# Patient Record
Sex: Female | Born: 1955 | Race: White | Hispanic: No | Smoking: Former smoker
Health system: Southern US, Community
[De-identification: ages and names within clinical notes are randomized; demographics above are authoritative.]

## PROBLEM LIST (undated history)

## (undated) DIAGNOSIS — I10 Essential (primary) hypertension: Secondary | ICD-10-CM

## (undated) DIAGNOSIS — E669 Obesity, unspecified: Secondary | ICD-10-CM

## (undated) DIAGNOSIS — C801 Malignant (primary) neoplasm, unspecified: Secondary | ICD-10-CM

## (undated) DIAGNOSIS — T7840XA Allergy, unspecified, initial encounter: Secondary | ICD-10-CM

## (undated) DIAGNOSIS — K219 Gastro-esophageal reflux disease without esophagitis: Secondary | ICD-10-CM

## (undated) DIAGNOSIS — F419 Anxiety disorder, unspecified: Secondary | ICD-10-CM

## (undated) DIAGNOSIS — E785 Hyperlipidemia, unspecified: Secondary | ICD-10-CM

## (undated) DIAGNOSIS — J301 Allergic rhinitis due to pollen: Secondary | ICD-10-CM

## (undated) DIAGNOSIS — F32A Depression, unspecified: Secondary | ICD-10-CM

## (undated) DIAGNOSIS — Z923 Personal history of irradiation: Secondary | ICD-10-CM

## (undated) DIAGNOSIS — C50919 Malignant neoplasm of unspecified site of unspecified female breast: Secondary | ICD-10-CM

## (undated) HISTORY — DX: Allergy, unspecified, initial encounter: T78.40XA

## (undated) HISTORY — DX: Allergic rhinitis due to pollen: J30.1

## (undated) HISTORY — DX: Malignant (primary) neoplasm, unspecified: C80.1

## (undated) HISTORY — DX: Gastro-esophageal reflux disease without esophagitis: K21.9

## (undated) HISTORY — PX: TUBAL LIGATION: SHX77

## (undated) HISTORY — DX: Malignant neoplasm of unspecified site of unspecified female breast: C50.919

## (undated) HISTORY — DX: Essential (primary) hypertension: I10

## (undated) HISTORY — DX: Depression, unspecified: F32.A

## (undated) HISTORY — DX: Hyperlipidemia, unspecified: E78.5

## (undated) HISTORY — DX: Obesity, unspecified: E66.9

## (undated) HISTORY — DX: Anxiety disorder, unspecified: F41.9

---

## 2003-08-20 HISTORY — PX: CHOLECYSTECTOMY: SHX55

## 2008-08-19 HISTORY — PX: BREAST LUMPECTOMY: SHX2

## 2009-04-05 ENCOUNTER — Encounter: Admission: RE | Admit: 2009-04-05 | Discharge: 2009-04-05 | Payer: Self-pay | Admitting: Surgery

## 2009-04-07 ENCOUNTER — Encounter: Admission: RE | Admit: 2009-04-07 | Discharge: 2009-04-07 | Payer: Self-pay | Admitting: Surgery

## 2009-04-11 ENCOUNTER — Encounter (INDEPENDENT_AMBULATORY_CARE_PROVIDER_SITE_OTHER): Payer: Self-pay | Admitting: Surgery

## 2009-04-11 ENCOUNTER — Ambulatory Visit (HOSPITAL_BASED_OUTPATIENT_CLINIC_OR_DEPARTMENT_OTHER): Admission: RE | Admit: 2009-04-11 | Discharge: 2009-04-11 | Payer: Self-pay | Admitting: Surgery

## 2009-04-28 ENCOUNTER — Ambulatory Visit: Payer: Self-pay | Admitting: Oncology

## 2009-05-10 LAB — CBC WITH DIFFERENTIAL/PLATELET
BASO%: 1.1 % (ref 0.0–2.0)
Basophils Absolute: 0 10*3/uL (ref 0.0–0.1)
HCT: 34.7 % — ABNORMAL LOW (ref 34.8–46.6)
HGB: 12.1 g/dL (ref 11.6–15.9)
MCHC: 34.9 g/dL (ref 31.5–36.0)
MONO#: 0.5 10*3/uL (ref 0.1–0.9)
NEUT%: 46.2 % (ref 38.4–76.8)
WBC: 4.1 10*3/uL (ref 3.9–10.3)
lymph#: 1.5 10*3/uL (ref 0.9–3.3)

## 2009-05-11 LAB — COMPREHENSIVE METABOLIC PANEL
ALT: 29 U/L (ref 0–35)
Albumin: 4.1 g/dL (ref 3.5–5.2)
CO2: 26 mEq/L (ref 19–32)
Calcium: 9.1 mg/dL (ref 8.4–10.5)
Chloride: 103 mEq/L (ref 96–112)
Creatinine, Ser: 0.83 mg/dL (ref 0.40–1.20)
Total Protein: 7 g/dL (ref 6.0–8.3)

## 2009-05-11 LAB — LACTATE DEHYDROGENASE: LDH: 105 U/L (ref 94–250)

## 2009-06-29 ENCOUNTER — Ambulatory Visit: Payer: Self-pay | Admitting: Oncology

## 2009-10-17 ENCOUNTER — Ambulatory Visit: Payer: Self-pay | Admitting: Oncology

## 2009-10-19 LAB — CBC WITH DIFFERENTIAL/PLATELET
Basophils Absolute: 0 10*3/uL (ref 0.0–0.1)
Eosinophils Absolute: 0.1 10*3/uL (ref 0.0–0.5)
HCT: 36.8 % (ref 34.8–46.6)
HGB: 12.6 g/dL (ref 11.6–15.9)
NEUT#: 2.1 10*3/uL (ref 1.5–6.5)
NEUT%: 48 % (ref 38.4–76.8)
RDW: 12.8 % (ref 11.2–14.5)
lymph#: 1.5 10*3/uL (ref 0.9–3.3)

## 2009-10-19 LAB — COMPREHENSIVE METABOLIC PANEL
Albumin: 3.9 g/dL (ref 3.5–5.2)
BUN: 19 mg/dL (ref 6–23)
CO2: 32 mEq/L (ref 19–32)
Calcium: 9.1 mg/dL (ref 8.4–10.5)
Chloride: 99 mEq/L (ref 96–112)
Creatinine, Ser: 0.81 mg/dL (ref 0.40–1.20)
Glucose, Bld: 97 mg/dL (ref 70–99)
Potassium: 3.6 mEq/L (ref 3.5–5.3)

## 2009-10-19 LAB — LACTATE DEHYDROGENASE: LDH: 135 U/L (ref 94–250)

## 2009-10-19 LAB — VITAMIN D 25 HYDROXY (VIT D DEFICIENCY, FRACTURES): Vit D, 25-Hydroxy: 30 ng/mL (ref 30–89)

## 2010-10-19 ENCOUNTER — Other Ambulatory Visit: Payer: Self-pay | Admitting: Oncology

## 2010-10-19 ENCOUNTER — Encounter (HOSPITAL_BASED_OUTPATIENT_CLINIC_OR_DEPARTMENT_OTHER): Payer: BC Managed Care – PPO | Admitting: Oncology

## 2010-10-19 DIAGNOSIS — C50519 Malignant neoplasm of lower-outer quadrant of unspecified female breast: Secondary | ICD-10-CM

## 2010-10-19 LAB — COMPREHENSIVE METABOLIC PANEL
ALT: 83 U/L — ABNORMAL HIGH (ref 0–35)
Alkaline Phosphatase: 251 U/L — ABNORMAL HIGH (ref 39–117)
CO2: 32 mEq/L (ref 19–32)
Potassium: 3.8 mEq/L (ref 3.5–5.3)
Sodium: 140 mEq/L (ref 135–145)
Total Bilirubin: 0.6 mg/dL (ref 0.3–1.2)
Total Protein: 7.6 g/dL (ref 6.0–8.3)

## 2010-10-19 LAB — CBC WITH DIFFERENTIAL/PLATELET
BASO%: 0.9 % (ref 0.0–2.0)
LYMPH%: 29.7 % (ref 14.0–49.7)
MCHC: 34.4 g/dL (ref 31.5–36.0)
MONO#: 0.6 10*3/uL (ref 0.1–0.9)
Platelets: 229 10*3/uL (ref 145–400)
RBC: 3.95 10*6/uL (ref 3.70–5.45)
RDW: 13 % (ref 11.2–14.5)
WBC: 5 10*3/uL (ref 3.9–10.3)
lymph#: 1.5 10*3/uL (ref 0.9–3.3)

## 2010-10-19 LAB — LACTATE DEHYDROGENASE: LDH: 107 U/L (ref 94–250)

## 2010-10-26 ENCOUNTER — Encounter (HOSPITAL_BASED_OUTPATIENT_CLINIC_OR_DEPARTMENT_OTHER): Payer: BC Managed Care – PPO | Admitting: Oncology

## 2010-10-26 DIAGNOSIS — C50519 Malignant neoplasm of lower-outer quadrant of unspecified female breast: Secondary | ICD-10-CM

## 2010-11-24 LAB — CBC
HCT: 36.3 % (ref 36.0–46.0)
MCV: 89.7 fL (ref 78.0–100.0)
RBC: 4.05 MIL/uL (ref 3.87–5.11)
WBC: 4.2 10*3/uL (ref 4.0–10.5)

## 2010-11-24 LAB — COMPREHENSIVE METABOLIC PANEL
BUN: 12 mg/dL (ref 6–23)
CO2: 33 mEq/L — ABNORMAL HIGH (ref 19–32)
Chloride: 105 mEq/L (ref 96–112)
Creatinine, Ser: 0.81 mg/dL (ref 0.4–1.2)
GFR calc non Af Amer: >60 mL/min (ref >60)
Total Bilirubin: 0.6 mg/dL (ref 0.3–1.2)

## 2010-11-24 LAB — DIFFERENTIAL
Basophils Absolute: 0 10*3/uL (ref 0.0–0.1)
Lymphocytes Relative: 34 % (ref 12–46)
Neutro Abs: 2.2 10*3/uL (ref 1.7–7.7)
Neutrophils Relative %: 51 % (ref 43–77)

## 2010-12-20 ENCOUNTER — Encounter (INDEPENDENT_AMBULATORY_CARE_PROVIDER_SITE_OTHER): Payer: Self-pay | Admitting: Surgery

## 2011-01-01 NOTE — Op Note (Signed)
[name], [name]              ACCOUNT NO.:  [id number]   MEDICAL RECORD NO.:  [id number]          PATIENT TYPE:  AMB   LOCATION:  DSC                          FACILITY:  [facility]   PHYSICIAN:  Thomas A. Cornett, M.D.DATE OF BIRTH:  1955-11-16   DATE OF PROCEDURE:  04/11/2009  DATE OF DISCHARGE:                               OPERATIVE REPORT   PREOPERATIVE DIAGNOSIS:  Right breast cancer stage I.   POSTOPERATIVE DIAGNOSIS:  Right breast cancer stage I.   PROCEDURES:  1. Right breast partial mastectomy with needle localization.  2. Right sentinel lymph node mapping with injection of methylene blue      dye.   SURGEON:  Maisie Fus A. Cornett, MD   ANESTHESIA:  LMA with 0.25% Sensorcaine local.   ESTIMATED BLOOD LOSS:  30 mL.   SPECIMENS:  1. Right breast mass with localizing wire and clip to pathology.  2. Right axillary sentinel lymph nodes x6 negative by touch prep.   DRAINS:  None.   INDICATIONS FOR PROCEDURE:  The patient is a 55 year old female who was  found to have right breast cancer.  It was a centrally located lesion  under 2 cm.  We felt we could attempt breast conserving measures in her.  She wished to undergo lumpectomy with sentinel lymph node mapping for  this.  Informed consent was obtained.   DESCRIPTION OF PROCEDURE:  The patient was brought to the operating room  and placed supine.  After induction of general anesthesia, the right  breast was prepped and draped in sterile fashion.  4 mL of methylene  blue dye were injected in a subareolar, subcutaneous position.  Neoprobe  was used.  She underwent preop nuclear medicine injection and wire  localization by the radiologist.  Neoprobe was used to find the hot spot  in right axilla and incision was made in the right axilla.  Dissection  was carried into the right axilla.  We initially identified what was  felt to be 3 hot sentinel nodes, 2 and 3 were blue.  The final specimen  noted there were 3 additional  nodes with these nodes and these were all  negative by touch prep.  Hemostasis was achieved in the axilla.   The lumpectomy is done next.  Curvilinear incision was made along the  lateral border of the right nipple.  This lesion was subareolar, but I  felt there is an ovary between the mass in the nipple itself where I  could do a central lumpectomy.  I used cautery to dissect around the  mass.  There were some fibrocystic changes with it which made it  difficult, but I could feel the mass and was felt separate in the  fibrocystic changes.  The mass was taken as well as an additional medial  margin was taken as well.  Radiographs revealed this to be adequate.  The clip and wire were in the specimen.  There is a significant central  lumpectomy cavity.  I mobilized the breast tissue off the chest wall and  I closed this in layers with a deep layer to fill in  this gap as best as  I could.  I closed it once with 3-0 Vicryl and 3-0 Monocryl to the skin.  It did not likely looks when we took out the 3-0 Monocryl.  I trimmed  some more nipple skin away since it looks somewhat ischemic to me and  then re-closed it again actually was much happy with this cosmetic  result.  There was some reduction in size in the right breast, but I  think the cavity closed as well as it could.  Considering the location  of the tumor in the nipple, I felt the margins were going to be negative  at least grossly.  The axilla was closed in a similar fashion with a  deep layer of 3-0 Vicryl and subsequent 4-0 Monocryl stitch.  Dermabond  was applied.  All final counts of sponge, needle, and instruments were  found to be correct at this portion of the case.  The patient was awoke  after Ace wrapping the right breast, taken to recovery in satisfactory  condition.      Thomas A. Cornett, M.D.  Electronically Signed     TAC/MEDQ  D:  04/11/2009  T:  04/12/2009  Job:  [id number]   cc:   [name]

## 2011-05-13 ENCOUNTER — Encounter: Payer: Self-pay | Admitting: Surgery

## 2011-06-14 ENCOUNTER — Encounter (INDEPENDENT_AMBULATORY_CARE_PROVIDER_SITE_OTHER): Payer: Self-pay | Admitting: Surgery

## 2011-09-16 ENCOUNTER — Encounter (INDEPENDENT_AMBULATORY_CARE_PROVIDER_SITE_OTHER): Payer: Self-pay | Admitting: Surgery

## 2011-09-16 ENCOUNTER — Ambulatory Visit (INDEPENDENT_AMBULATORY_CARE_PROVIDER_SITE_OTHER): Payer: BC Managed Care – PPO | Admitting: Surgery

## 2011-09-16 VITALS — BP 134/96 | HR 60 | Temp 98.0°F | Resp 12 | Ht 67.0 in | Wt 200.6 lb

## 2011-09-16 DIAGNOSIS — Z853 Personal history of malignant neoplasm of breast: Secondary | ICD-10-CM

## 2011-09-16 NOTE — Progress Notes (Signed)
NAME: [name]       DOB: 05/15/1956           DATE: 09/16/2011       MRN: [id number]   [name] is a 56 y.o.Marland Kitchenfemale who presents for routine followup of her Right breast cancer Stage 1 diagnosed in 03/2009  and treated with breast conservation. She has no problems or concerns on either side.  PFSH: She has had no significant changes since the last visit here.  ROS: There have been no significant changes since the last visit here  EXAM: General: The patient is alert, oriented, generally healty appearing, NAD. Mood and affect are normal.  Breasts:  Post op changes to right breast no masses  Left breast nornal  Lymphatics: She has no axillary or supraclavicular adenopathy on either side.  Extremities: Full ROM of the surgical side with no lymphedema noted.  Data Reviewed: Mammogram 2012 august birads 2  Impression: Doing well, with no evidence of recurrent cancer or new cancer  Plan: Will continue to follow up on an annual basis here.

## 2011-09-16 NOTE — Patient Instructions (Signed)
Follow up 1 year 

## 2011-09-30 ENCOUNTER — Telehealth: Payer: Self-pay | Admitting: *Deleted

## 2011-09-30 NOTE — Telephone Encounter (Signed)
left message with patient's husband informing her of the new date and time of the appointment

## 2011-10-18 ENCOUNTER — Other Ambulatory Visit: Payer: BC Managed Care – PPO

## 2011-10-25 ENCOUNTER — Ambulatory Visit: Payer: BC Managed Care – PPO | Admitting: Physician Assistant

## 2011-10-30 ENCOUNTER — Other Ambulatory Visit: Payer: BC Managed Care – PPO | Admitting: Lab

## 2011-10-30 LAB — CBC WITH DIFFERENTIAL/PLATELET
BASO%: 0.8 % (ref 0.0–2.0)
EOS%: 3.4 % (ref 0.0–7.0)
LYMPH%: 32.2 % (ref 14.0–49.7)
MCH: 30 pg (ref 25.1–34.0)
MCHC: 34.1 g/dL (ref 31.5–36.0)
MCV: 88.1 fL (ref 79.5–101.0)
MONO%: 12.1 % (ref 0.0–14.0)
Platelets: 233 10*3/uL (ref 145–400)
RBC: 4.15 10*6/uL (ref 3.70–5.45)
WBC: 4 10*3/uL (ref 3.9–10.3)

## 2011-10-31 LAB — VITAMIN D 25 HYDROXY (VIT D DEFICIENCY, FRACTURES): Vit D, 25-Hydroxy: 38 ng/mL (ref 30–89)

## 2011-10-31 LAB — COMPREHENSIVE METABOLIC PANEL
Albumin: 4.2 g/dL (ref 3.5–5.2)
Alkaline Phosphatase: 145 U/L — ABNORMAL HIGH (ref 39–117)
BUN: 25 mg/dL — ABNORMAL HIGH (ref 6–23)
Creatinine, Ser: 0.95 mg/dL (ref 0.50–1.10)
Glucose, Bld: 60 mg/dL — ABNORMAL LOW (ref 70–99)
Total Bilirubin: 0.4 mg/dL (ref 0.3–1.2)

## 2011-11-06 ENCOUNTER — Ambulatory Visit (HOSPITAL_BASED_OUTPATIENT_CLINIC_OR_DEPARTMENT_OTHER): Payer: BC Managed Care – PPO | Admitting: Oncology

## 2011-11-06 ENCOUNTER — Telehealth: Payer: Self-pay | Admitting: *Deleted

## 2011-11-06 VITALS — BP 137/83 | HR 61 | Temp 97.4°F | Ht 67.0 in | Wt 200.1 lb

## 2011-11-06 DIAGNOSIS — C50519 Malignant neoplasm of lower-outer quadrant of unspecified female breast: Secondary | ICD-10-CM

## 2011-11-06 DIAGNOSIS — C50919 Malignant neoplasm of unspecified site of unspecified female breast: Secondary | ICD-10-CM

## 2011-11-06 DIAGNOSIS — E559 Vitamin D deficiency, unspecified: Secondary | ICD-10-CM

## 2011-11-06 NOTE — Telephone Encounter (Signed)
gave patient appointment for 05-2012 printed out calendar and gave to the patient 

## 2011-11-06 NOTE — Progress Notes (Signed)
Hematology and Oncology Follow Up Visit  [name] [id number] 05-10-1956 56 y.o. 11/06/2011 11:53 PM PCP  Principle Diagnosis: node negative , er positive , pr positive s/p lumpectomy, radiation therapy completed 11/10 on femara.  Interim History:  There have been no intercurrent illness, hospitalizations or medication changes.  Medications: I have reviewed the patient's current medications.  Allergies: No Known Allergies  Past Medical History, Surgical history, Social history, and Family History were reviewed and updated.  Review of Systems: Constitutional:  Negative for fever, chills, night sweats, anorexia, weight loss, pain., +hot flashes Cardiovascular: no chest pain or dyspnea on exertion Respiratory: no cough, shortness of breath, or wheezing Neurological: negative Dermatological: negative ENT: negative Skin Gastrointestinal: no abdominal pain, change in bowel habits, or black or bloody stools Genito-Urinary: no dysuria, trouble voiding, or hematuria Hematological and Lymphatic: negative Breast: negative Musculoskeletal: negative Remaining ROS negative.  Physical Exam: Blood pressure 137/83, pulse 61, temperature 97.4 F (36.3 C), height 5\' 7"  (1.702 m), weight 200 lb 1.6 oz (90.765 kg). ECOG: 0 General appearance: alert, cooperative and appears stated age Head: Normocephalic, without obvious abnormality, atraumatic Neck: no adenopathy, no carotid bruit, no JVD, supple, symmetrical, trachea midline and thyroid not enlarged, symmetric, no tenderness/mass/nodules Lymph nodes: Cervical, supraclavicular, and axillary nodes normal. Cardiac : regular rate and rhythm, no murmurs or gallops Pulmonary:clear to auscultation bilaterally and normal percussion bilaterally Breasts: inspection negative, no nipple discharge or bleeding, no masses or nodularity palpable Abdomen:soft, non-tender; bowel sounds normal; no masses,  no organomegaly Extremities negative Neuro: alert,  oriented, normal speech, no focal findings or movement disorder noted  Lab Results: Lab Results  Component Value Date   WBC 4.0 10/30/2011   HGB 12.5 10/30/2011   HCT 36.6 10/30/2011   MCV 88.1 10/30/2011   PLT 233 10/30/2011     Chemistry      Component Value Date/Time   NA 139 10/30/2011 1000   K 4.4 10/30/2011 1000   CL 99 10/30/2011 1000   CO2 30 10/30/2011 1000   BUN 25* 10/30/2011 1000   CREATININE 0.95 10/30/2011 1000      Component Value Date/Time   CALCIUM 9.4 10/30/2011 1000   ALKPHOS 145* 10/30/2011 1000   AST 24 10/30/2011 1000   ALT 25 10/30/2011 1000   BILITOT 0.4 10/30/2011 1000      .pathology. Radiological Studies: chest X-ray n/a Mammogram Due 8/13 Bone density Due 8/13  Impression and Plan: Pt is doing well, aside from minor HF , we will see her in 6 mo for f/u with appropriate imaging  Studies. I have encouraged her to take additional vitamin D  More than 50% of the visit was spent in patient-related counselling   Pierce Crane, MD 3/20/201311:53 PM

## 2011-12-16 ENCOUNTER — Other Ambulatory Visit: Payer: Self-pay | Admitting: *Deleted

## 2011-12-16 MED ORDER — LETROZOLE 2.5 MG PO TABS: 2.5000 mg | ORAL_TABLET | Freq: Every day | ORAL | Status: DC

## 2012-04-03 ENCOUNTER — Telehealth: Payer: Self-pay | Admitting: *Deleted

## 2012-04-03 NOTE — Telephone Encounter (Signed)
moved patient appointment to 05-04-2012 due to the md being on vac 

## 2012-04-07 ENCOUNTER — Telehealth: Payer: Self-pay | Admitting: *Deleted

## 2012-04-07 ENCOUNTER — Other Ambulatory Visit: Payer: Self-pay | Admitting: *Deleted

## 2012-04-07 NOTE — Telephone Encounter (Signed)
Bone density will starting at 2:00pm followed by the mammogram on 04-15-2012 at the [facility]

## 2012-04-28 ENCOUNTER — Encounter (INDEPENDENT_AMBULATORY_CARE_PROVIDER_SITE_OTHER): Payer: Self-pay

## 2012-05-04 ENCOUNTER — Other Ambulatory Visit: Payer: BC Managed Care – PPO | Admitting: Lab

## 2012-05-04 ENCOUNTER — Ambulatory Visit: Payer: BC Managed Care – PPO | Admitting: Oncology

## 2012-05-08 ENCOUNTER — Other Ambulatory Visit: Payer: BC Managed Care – PPO | Admitting: Lab

## 2012-05-08 ENCOUNTER — Ambulatory Visit: Payer: BC Managed Care – PPO | Admitting: Oncology

## 2012-05-26 ENCOUNTER — Other Ambulatory Visit: Payer: Self-pay | Admitting: *Deleted

## 2012-05-26 DIAGNOSIS — C50919 Malignant neoplasm of unspecified site of unspecified female breast: Secondary | ICD-10-CM

## 2012-05-26 DIAGNOSIS — E559 Vitamin D deficiency, unspecified: Secondary | ICD-10-CM

## 2012-05-27 ENCOUNTER — Other Ambulatory Visit (HOSPITAL_BASED_OUTPATIENT_CLINIC_OR_DEPARTMENT_OTHER): Payer: BC Managed Care – PPO | Admitting: Lab

## 2012-05-27 DIAGNOSIS — C50919 Malignant neoplasm of unspecified site of unspecified female breast: Secondary | ICD-10-CM

## 2012-05-27 DIAGNOSIS — E559 Vitamin D deficiency, unspecified: Secondary | ICD-10-CM

## 2012-05-27 LAB — CBC WITH DIFFERENTIAL/PLATELET
Basophils Absolute: 0.1 10*3/uL (ref 0.0–0.1)
EOS%: 1.8 % (ref 0.0–7.0)
Eosinophils Absolute: 0.1 10*3/uL (ref 0.0–0.5)
HGB: 12.7 g/dL (ref 11.6–15.9)
MCH: 30 pg (ref 25.1–34.0)
NEUT#: 3.5 10*3/uL (ref 1.5–6.5)
RDW: 13 % (ref 11.2–14.5)
lymph#: 1.7 10*3/uL (ref 0.9–3.3)

## 2012-05-27 LAB — COMPREHENSIVE METABOLIC PANEL (CC13)
AST: 41 U/L — ABNORMAL HIGH (ref 5–34)
Albumin: 3.6 g/dL (ref 3.5–5.0)
BUN: 19 mg/dL (ref 7.0–26.0)
Calcium: 9.7 mg/dL (ref 8.4–10.4)
Chloride: 100 mEq/L (ref 98–107)
Potassium: 4.2 mEq/L (ref 3.5–5.1)
Total Protein: 7.6 g/dL (ref 6.4–8.3)

## 2012-06-03 ENCOUNTER — Ambulatory Visit: Payer: BC Managed Care – PPO | Admitting: Oncology

## 2012-06-04 ENCOUNTER — Ambulatory Visit (HOSPITAL_BASED_OUTPATIENT_CLINIC_OR_DEPARTMENT_OTHER): Payer: BC Managed Care – PPO | Admitting: Oncology

## 2012-06-04 VITALS — BP 151/88 | HR 62 | Temp 98.4°F | Resp 20 | Ht 67.0 in | Wt 209.7 lb

## 2012-06-04 DIAGNOSIS — C50919 Malignant neoplasm of unspecified site of unspecified female breast: Secondary | ICD-10-CM

## 2012-06-04 DIAGNOSIS — Z170 Estrogen receptor positive status [ER+]: Secondary | ICD-10-CM

## 2012-06-04 DIAGNOSIS — C50519 Malignant neoplasm of lower-outer quadrant of unspecified female breast: Secondary | ICD-10-CM

## 2012-06-04 NOTE — Progress Notes (Signed)
Hematology and Oncology Follow Up Visit  [name] [id number] Jan 31, 1956 56 y.o. 06/04/2012 11:49 AM PCP  Principle Diagnosis: node negative , er positive , pr positive s/p lumpectomy, radiation therapy completed 11/10 on femara. She is doing well. She has no complaints. She is taking additional vitamin D.  Interim History:  There have been no intercurrent illness, hospitalizations or medication changes.  Medications: I have reviewed the patient's current medications.  Allergies: No Known Allergies  Past Medical History, Surgical history, Social history, and Family History were reviewed and updated.  Review of Systems: Constitutional:  Negative for fever, chills, night sweats, anorexia, weight loss, pain., +hot flashes Cardiovascular: no chest pain or dyspnea on exertion Respiratory: no cough, shortness of breath, or wheezing Neurological: negative Dermatological: negative ENT: negative Skin Gastrointestinal: no abdominal pain, change in bowel habits, or black or bloody stools Genito-Urinary: no dysuria, trouble voiding, or hematuria Hematological and Lymphatic: negative Breast: negative Musculoskeletal: negative Remaining ROS negative.  Physical Exam: Blood pressure 151/88, pulse 62, temperature 98.4 F (36.9 C), resp. rate 20, height 5\' 7"  (1.702 m), weight 209 lb 11.2 oz (95.119 kg). ECOG: 0 General appearance: alert, cooperative and appears stated age Head: Normocephalic, without obvious abnormality, atraumatic Neck: no adenopathy, no carotid bruit, no JVD, supple, symmetrical, trachea midline and thyroid not enlarged, symmetric, no tenderness/mass/nodules Lymph nodes: Cervical, supraclavicular, and axillary nodes normal. Cardiac : regular rate and rhythm, no murmurs or gallops Pulmonary:clear to auscultation bilaterally and normal percussion bilaterally Breasts: inspection negative, no nipple discharge or bleeding, no masses or nodularity palpable Abdomen:soft,  non-tender; bowel sounds normal; no masses,  no organomegaly Extremities negative Neuro: alert, oriented, normal speech, no focal findings or movement disorder noted  Lab Results: Lab Results  Component Value Date   WBC 6.0 05/27/2012   HGB 12.7 05/27/2012   HCT 37.5 05/27/2012   MCV 88.7 05/27/2012   PLT 232 05/27/2012     Chemistry      Component Value Date/Time   NA 137 05/27/2012 1549   NA 139 10/30/2011 1000   K 4.2 05/27/2012 1549   K 4.4 10/30/2011 1000   CL 100 05/27/2012 1549   CL 99 10/30/2011 1000   CO2 27 05/27/2012 1549   CO2 30 10/30/2011 1000   BUN 19.0 05/27/2012 1549   BUN 25* 10/30/2011 1000   CREATININE 0.9 05/27/2012 1549   CREATININE 0.95 10/30/2011 1000      Component Value Date/Time   CALCIUM 9.7 05/27/2012 1549   CALCIUM 9.4 10/30/2011 1000   ALKPHOS 190* 05/27/2012 1549   ALKPHOS 145* 10/30/2011 1000   AST 41* 05/27/2012 1549   AST 24 10/30/2011 1000   ALT 56* 05/27/2012 1549   ALT 25 10/30/2011 1000   BILITOT 0.40 05/27/2012 1549   BILITOT 0.4 10/30/2011 1000      .pathology. Radiological Studies: chest X-ray n/a Mammogram Due 8/13-wnl Bone density Due 8/13  Impression and Plan: Pt is doing well, aside from minor HF , we will see her in 6 mo for f/u with appropriate imaging  Studies. I have encouraged her to take additional vitamin D. She is been excised and has maintained so she will do some recumbent right and perhaps some water aerobics. I will plan to see her in followup in 6 months time.  More than 50% of the visit was spent in patient-related counselling   Pierce Crane, MD 10/17/201311:49 AM

## 2012-06-04 NOTE — Progress Notes (Signed)
Hematology and Oncology Follow Up Visit  [name] [id number] 10/19/1955 56 y.o. 06/04/2012 11:51 AM PCP  Principle Diagnosis: node negative , er positive , pr positive s/p lumpectomy, radiation therapy completed 11/10 on femara.  Interim History:  There have been no intercurrent illness, hospitalizations or medication changes.  Medications: I have reviewed the patient's current medications.  Allergies: No Known Allergies  Past Medical History, Surgical history, Social history, and Family History were reviewed and updated.  Review of Systems: Constitutional:  Negative for fever, chills, night sweats, anorexia, weight loss, pain., +hot flashes Cardiovascular: no chest pain or dyspnea on exertion Respiratory: no cough, shortness of breath, or wheezing Neurological: negative Dermatological: negative ENT: negative Skin Gastrointestinal: no abdominal pain, change in bowel habits, or black or bloody stools Genito-Urinary: no dysuria, trouble voiding, or hematuria Hematological and Lymphatic: negative Breast: negative Musculoskeletal: negative Remaining ROS negative.  Physical Exam: Blood pressure 151/88, pulse 62, temperature 98.4 F (36.9 C), resp. rate 20, height 5\' 7"  (1.702 m), weight 209 lb 11.2 oz (95.119 kg). ECOG: 0 General appearance: alert, cooperative and appears stated age Head: Normocephalic, without obvious abnormality, atraumatic Neck: no adenopathy, no carotid bruit, no JVD, supple, symmetrical, trachea midline and thyroid not enlarged, symmetric, no tenderness/mass/nodules Lymph nodes: Cervical, supraclavicular, and axillary nodes normal. Cardiac : regular rate and rhythm, no murmurs or gallops Pulmonary:clear to auscultation bilaterally and normal percussion bilaterally Breasts: inspection negative, no nipple discharge or bleeding, no masses or nodularity palpable Abdomen:soft, non-tender; bowel sounds normal; no masses,  no organomegaly Extremities  negative Neuro: alert, oriented, normal speech, no focal findings or movement disorder noted  Lab Results: Lab Results  Component Value Date   WBC 6.0 05/27/2012   HGB 12.7 05/27/2012   HCT 37.5 05/27/2012   MCV 88.7 05/27/2012   PLT 232 05/27/2012     Chemistry      Component Value Date/Time   NA 137 05/27/2012 1549   NA 139 10/30/2011 1000   K 4.2 05/27/2012 1549   K 4.4 10/30/2011 1000   CL 100 05/27/2012 1549   CL 99 10/30/2011 1000   CO2 27 05/27/2012 1549   CO2 30 10/30/2011 1000   BUN 19.0 05/27/2012 1549   BUN 25* 10/30/2011 1000   CREATININE 0.9 05/27/2012 1549   CREATININE 0.95 10/30/2011 1000      Component Value Date/Time   CALCIUM 9.7 05/27/2012 1549   CALCIUM 9.4 10/30/2011 1000   ALKPHOS 190* 05/27/2012 1549   ALKPHOS 145* 10/30/2011 1000   AST 41* 05/27/2012 1549   AST 24 10/30/2011 1000   ALT 56* 05/27/2012 1549   ALT 25 10/30/2011 1000   BILITOT 0.40 05/27/2012 1549   BILITOT 0.4 10/30/2011 1000      .pathology. Radiological Studies: chest X-ray n/a Mammogram Due 8/13 Bone density Due 8/13  Impression and Plan: Pt is doing well, aside from minor HF , we will see her in 6 mo for f/u with appropriate imaging  Studies. I have encouraged her to take additional vitamin D  More than 50% of the visit was spent in patient-related counselling   Pierce Crane, MD 10/17/201311:51 AM

## 2012-06-11 NOTE — Telephone Encounter (Signed)
ON 06/11/12 RECEIVED A FAX FROM [facility] [address] IN [place] CONCERNING A REFILL REQUEST FOR LETROZOLE. CALLED [facility] ABOUT REFILL REQUEST. ON 12/16/11 THE LETROZOLE WAS REFILLED WITH FOUR ADDITIONAL REFILLS IN [facility]S RECORD. AN ADDITIONAL SEVEN REFILLS WERE GIVEN TO GET PT. TO HER NEXT APPOINTMENT WITH DR.RUBIN.

## 2012-10-03 ENCOUNTER — Other Ambulatory Visit: Payer: Self-pay

## 2012-11-07 ENCOUNTER — Encounter: Payer: Self-pay | Admitting: Oncology

## 2012-11-07 ENCOUNTER — Telehealth: Payer: Self-pay | Admitting: Oncology

## 2012-11-07 NOTE — Telephone Encounter (Signed)
Former PT pt reassigned to [facility] [employer]. S/w pt today re appt for 5/15 w/[facility]. Also confirmed 5/9 lb. Letter mailed.

## 2012-12-07 ENCOUNTER — Telehealth: Payer: Self-pay | Admitting: Oncology

## 2012-12-23 ENCOUNTER — Telehealth: Payer: Self-pay | Admitting: *Deleted

## 2012-12-23 NOTE — Telephone Encounter (Signed)
sw pt gv appt for 5/15 @ 2pm. Pt is aware...td

## 2012-12-23 NOTE — Telephone Encounter (Signed)
i also mailed a cal...td 

## 2012-12-24 ENCOUNTER — Other Ambulatory Visit: Payer: Self-pay | Admitting: Physician Assistant

## 2012-12-24 DIAGNOSIS — C50911 Malignant neoplasm of unspecified site of right female breast: Secondary | ICD-10-CM

## 2012-12-25 ENCOUNTER — Other Ambulatory Visit (HOSPITAL_BASED_OUTPATIENT_CLINIC_OR_DEPARTMENT_OTHER): Payer: BC Managed Care – PPO | Admitting: Lab

## 2012-12-25 DIAGNOSIS — C50911 Malignant neoplasm of unspecified site of right female breast: Secondary | ICD-10-CM

## 2012-12-25 DIAGNOSIS — C50519 Malignant neoplasm of lower-outer quadrant of unspecified female breast: Secondary | ICD-10-CM

## 2012-12-25 LAB — CBC WITH DIFFERENTIAL/PLATELET
Basophils Absolute: 0.1 10*3/uL (ref 0.0–0.1)
Eosinophils Absolute: 0.1 10*3/uL (ref 0.0–0.5)
HGB: 12.7 g/dL (ref 11.6–15.9)
MCV: 86.7 fL (ref 79.5–101.0)
MONO#: 0.6 10*3/uL (ref 0.1–0.9)
MONO%: 12.7 % (ref 0.0–14.0)
NEUT#: 2.2 10*3/uL (ref 1.5–6.5)
RBC: 4.33 10*6/uL (ref 3.70–5.45)
RDW: 13 % (ref 11.2–14.5)
WBC: 4.5 10*3/uL (ref 3.9–10.3)
lymph#: 1.5 10*3/uL (ref 0.9–3.3)

## 2012-12-25 LAB — COMPREHENSIVE METABOLIC PANEL (CC13)
Albumin: 3.4 g/dL — ABNORMAL LOW (ref 3.5–5.0)
Alkaline Phosphatase: 157 U/L — ABNORMAL HIGH (ref 40–150)
Calcium: 9.3 mg/dL (ref 8.4–10.4)
Chloride: 99 mEq/L (ref 98–107)
Glucose: 91 mg/dl (ref 70–99)
Potassium: 4 mEq/L (ref 3.5–5.1)
Sodium: 140 mEq/L (ref 136–145)
Total Protein: 7.3 g/dL (ref 6.4–8.3)

## 2012-12-31 ENCOUNTER — Telehealth: Payer: Self-pay | Admitting: Oncology

## 2012-12-31 ENCOUNTER — Encounter: Payer: Self-pay | Admitting: Family

## 2012-12-31 ENCOUNTER — Ambulatory Visit (HOSPITAL_BASED_OUTPATIENT_CLINIC_OR_DEPARTMENT_OTHER): Payer: BC Managed Care – PPO | Admitting: Family

## 2012-12-31 ENCOUNTER — Ambulatory Visit: Payer: Self-pay | Admitting: Family

## 2012-12-31 VITALS — BP 130/74 | HR 60 | Temp 98.0°F | Resp 20 | Ht 67.0 in | Wt 195.6 lb

## 2012-12-31 DIAGNOSIS — N899 Noninflammatory disorder of vagina, unspecified: Secondary | ICD-10-CM

## 2012-12-31 DIAGNOSIS — C50911 Malignant neoplasm of unspecified site of right female breast: Secondary | ICD-10-CM

## 2012-12-31 DIAGNOSIS — N898 Other specified noninflammatory disorders of vagina: Secondary | ICD-10-CM

## 2012-12-31 DIAGNOSIS — R232 Flushing: Secondary | ICD-10-CM

## 2012-12-31 DIAGNOSIS — M8580 Other specified disorders of bone density and structure, unspecified site: Secondary | ICD-10-CM

## 2012-12-31 DIAGNOSIS — C50519 Malignant neoplasm of lower-outer quadrant of unspecified female breast: Secondary | ICD-10-CM

## 2012-12-31 DIAGNOSIS — M899 Disorder of bone, unspecified: Secondary | ICD-10-CM

## 2012-12-31 DIAGNOSIS — Z170 Estrogen receptor positive status [ER+]: Secondary | ICD-10-CM

## 2012-12-31 HISTORY — DX: Malignant neoplasm of unspecified site of right female breast: C50.911

## 2012-12-31 NOTE — Patient Instructions (Addendum)
Please contact us at [phone] if you have any questions or concerns.  Please continue to do well and enjoy life!!!  Get plenty of rest, drink plenty of water, exercise daily, eat a balanced diet.  Take Caltrate 1200 mg daily and Vitamin D3 2000 IUs daily.

## 2012-12-31 NOTE — Progress Notes (Signed)
[facility]  Telephone:(336) [phone] [phone]) [phone]  OFFICE PROGRESS NOTE   ID: [name]   DOB: 03/23/1956  MR#: [id number]  JYN#:[id number]   PCP: Desmond Dike MD (Rosalita Levan, [place]) GYN: Oliva Bustard, M.D. [place], [place]) SU: Harriette Bouillon, M.D.   HISTORY OF PRESENT ILLNESS: From Dr. Theron Arista Rubin's new patient evaluation note dated 05/10/2009: "This is a 57 year old woman here today with her husband, [name], for evaluation and discussion of recent diagnosis of breast cancer.  This woman has a mammogram every year.  She had a shorter follow-up mammogram because of a concern that there was a palpable thickening in her breast.  She subsequently had a follow-up mammogram on 03/20/2009 that showed a 1.9 cm mass anterior third of the right breast.  Biopsy was recommended.  Biopsy on 03/22/2009 showed invasive ductal cancer which appeared to be low grade.  ER/PR positive at 90 and 88% respectively, proliferative index 15%. HER-2 was not over amplified, ratio 1.19.  Preoperative MRI scan was performed and showed a solitary 1.7 cm regular mass in the lower outer quadrant of the right breast. The patient elects to undergo lumpectomy with sentinel lymph node evaluation 04/11/2009.  A 1.6 cm invasive ductal cancer was removed.  This had an overall histologic grade of 1 of 3.  Six lymph nodes were removed and these were all negative.  Margins were minimally invasive at 3 mm.  The patient has had an unremarkable postoperative course."  Her subsequent history is as detailed below.   INTERVAL HISTORY: Dr. Darnelle Catalan and I saw Mrs. [name] today for follow up of invasive ductal carcinoma of the right breast.  The patient was last seen by Dr. Donnie Coffin on  06/04/2012.   Since her last office visit, the patient has been doing relatively well.  She is establishing herself with Dr. Darrall Dears service today.  REVIEW OF SYSTEMS: A 10 point review of systems was completed and is  negative except minor hot flashes and vaginal dryness the patient has previously been provided with a list of remedies for vaginal dryness.  The patient denies any other symptomatology.   PAST MEDICAL HISTORY: Past Medical History  Diagnosis Date  . Hypertension   . Hyperlipidemia   . Cancer     rt breast    PAST SURGICAL HISTORY: Past Surgical History  Procedure Laterality Date  . Tubal ligation    . Cholecystectomy  2005  . Breast lumpectomy  2010    right    FAMILY HISTORY Family History  Problem Relation Age of Onset  . Cancer Father     kidney  . Heart disease Father   . Hyperlipidemia Mother   . Diabetes Mother   Father is deceased from ureteral cancer.  Mother is alive and well.  She has two brothers who are alive and well.    GYNECOLOGIC HISTORY: G2 P2.  Menarche at age 79, menopause at age 69.  On Prempro for 4-5 years.    SOCIAL HISTORY: Mrs. Ramaker has been married to her husband [name] for over 30 years.  They live in [place], [place].  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  Husband works as a Curator for the [employer] in [place] but he is currently out on disability related to [employer].  They have two adult daughters. One lives in [place] and one lives in [place].  They have no grandchildren.  In her spare time the patient enjoys going to the beach,  traveling, spending time with her family, working on the computer, doing Environmental health practitioner, and gardening.   ADVANCED DIRECTIVES: Not on file  HEALTH MAINTENANCE: History  Substance Use Topics  . Smoking status: Former Games developer  . Smokeless tobacco: Never Used     Comment: Quit over 25 years ago  . Alcohol Use: Yes     Comment: Occasional    Colonoscopy: Not on file PAP: Not on file Bone density: The patient's last bone density examination on 04/15/2012 showed a T score of -1.5 (osteopenia). Lipid panel: Not on file  No Known Allergies  Current Outpatient Prescriptions   Medication Sig Dispense Refill  . aspirin 81 MG tablet Take 81 mg by mouth daily.       . Calcium Carbonate-Vitamin D (CALTRATE 600+D PO) Take by mouth 2 (two) times daily.      . Cholecalciferol (VITAMIN D-3) 1000 UNITS CAPS Take 2 capsules by mouth daily. 2000 IUs daily      . letrozole (FEMARA) 2.5 MG tablet Take 1 tablet (2.5 mg total) by mouth daily.  30 tablet  11  . Omega-3 Fatty Acids (FISH OIL) 1200 MG CAPS Take 1,200 mg by mouth daily. Krill oil      . valsartan-hydrochlorothiazide (DIOVAN-HCT) 160-12.5 MG per tablet Take 1 tablet by mouth daily.       No current facility-administered medications for this visit.    OBJECTIVE: Filed Vitals:   12/31/12 1357  BP: 130/74  Pulse: 60  Temp: 98 F (36.7 C)  Resp: 20     Body mass index is 30.63 kg/(m^2).      ECOG FS: 1 - Symptomatic but completely ambulatory  General appearance: Alert, cooperative, well nourished, no apparent distress Head: Normocephalic, without obvious abnormality, atraumatic Eyes: Conjunctivae/corneas clear, PERRLA, EOMI Nose: Nares, septum and mucosa are normal, no drainage or sinus tenderness Neck: No adenopathy, supple, symmetrical, trachea midline, thyroid not enlarged, no tenderness Resp: Clear to auscultation bilaterally Cardio: Regular rate and rhythm, S1, S2 normal, no murmur, click, rub or gallop Breasts: Glandular breast tissue bilaterally, right breast has architectural and radiation changes noted in addition to well-healed surgical scars, bilateral firm medial/inframammary ridge,  prominent right breast scar tissue noted, no nipple inversion, bilateral axillary fullness, striae on breasts bilaterally GI: Soft, distended, non-tender, hypoactive bowel sounds, no organomegaly, well-healed abdominal surgical scar Extremities: Extremities normal, atraumatic, no cyanosis or edema Lymph nodes: Cervical and supraclavicular nodes are normal Neurologic: Grossly normal   LAB RESULTS: Lab Results   Component Value Date   WBC 4.5 12/25/2012   NEUTROABS 2.2 12/25/2012   HGB 12.7 12/25/2012   HCT 37.5 12/25/2012   MCV 86.7 12/25/2012   PLT 215 12/25/2012      Chemistry      Component Value Date/Time   NA 140 12/25/2012 0912   NA 139 10/30/2011 1000   K 4.0 12/25/2012 0912   K 4.4 10/30/2011 1000   CL 99 12/25/2012 0912   CL 99 10/30/2011 1000   CO2 29 12/25/2012 0912   CO2 30 10/30/2011 1000   BUN 20.9 12/25/2012 0912   BUN 25* 10/30/2011 1000   CREATININE 0.8 12/25/2012 0912   CREATININE 0.95 10/30/2011 1000      Component Value Date/Time   CALCIUM 9.3 12/25/2012 0912   CALCIUM 9.4 10/30/2011 1000   ALKPHOS 157* 12/25/2012 0912   ALKPHOS 145* 10/30/2011 1000   AST 32 12/25/2012 0912   AST 24 10/30/2011 1000   ALT 35 12/25/2012 0912   ALT  25 10/30/2011 1000   BILITOT 0.42 12/25/2012 0912   BILITOT 0.4 10/30/2011 1000      Lab Results  Component Value Date   LABCA2 26 05/27/2012    Urinalysis No results found for this basename: colorurine,  appearanceur,  labspec,  phurine,  glucoseu,  hgbur,  bilirubinur,  ketonesur,  proteinur,  urobilinogen,  nitrite,  leukocytesur    STUDIES: No results found.  ASSESSMENT: 57 y.o. [place], [place] woman: 1.  Status post right breast needle core biopsy on 03/22/2009 which showed invasive ductal carcinoma, ER 98%, PR 80%, Ki-67 15%, HER-2/neu by CISH no amplification with a ratio of 1.19.  2.  Status post bilateral breast MRI on 04/05/2009 which showed a moderate background parenchymal enhancement bilaterally, with numerous scattered foci of enhancement in both breasts.  There is a dominant irregular enhancing mass in the lower outer quadrant of the right breast that measures 1.6 x 1.6 x 1.7 cm.  A 4 mm inframammary lymph node is visualized 2 cm directly posterior to the mass aside from a moderate background enhancement pattern of the left breast, no dominant area of enhancement is identified in the left breast to suggest malignancy.  No enlarged or suspicious  axillary or internal mammary chain lymph nodes were identified.  3.  Status post right breast needle localized lumpectomy with sentinel node biopsy and additional medial margins on 04/11/2009 which showed a stage I, pT1c pN0, 1.6 cm invasive and in situ ductal carcinoma, grade 1, ER 90%, PR 88%, Ki-67 15%, HER-2/neu by CISH no amplification with a ratio 1.19, 0/6 positive lymph nodes.  Oncotype DX performed showed a breast cancer recurrence score of 12% with an average rate of distant recurrence at 8%.  4.  Status post radiation therapy completed in 06/2009 in [place], [place].  5.  According to Dr. Renelda Loma notes the patient started antiestrogen therapy with Letrozole 2.5 mg by mouth daily in 06/2009 or 07/2009, however, she does not think she started antiestrogen therapy with Letrozole until 10/2009.  6.  The patient's last bone density scan on 04/15/2012 showed a T score of -1.5 (osteopenia).  7.  Hot flashes and vaginal dryness.  PLAN: The patient will continue antiestrogen therapy with Letrozole 2.5 mg by mouth daily until 10/2014, at which time she will have completed at least 5 years of antiestrogen therapy.  The patient's last bilateral 3-D digital diagnostic mammogram on 04/15/2012 showed scattered parenchymal densities are present.  The breast parenchymal pattern is stable with no new or worrisome finding in either breast.  Benign findings.  The patient states that she does not need a refill of Letrozole at this time and her mammogram for 2014 has already been scheduled.  We've asked the patient to discuss with her primary care physician Dr. Sheria Lang, in [place], [place] his thoughts on her taking/prescribing Boniva or Fosamax for her osteopenia.  We asked the patient to take Caltrate 1200 mg by mouth daily and vitamin D 3 2000 IUs daily for osteopenia.  The patient states her hot flashes and vaginal dryness are tolerable.  She states she currently uses Replens for vaginal  dryness.  We asked that she alternate office visits between [place] and Dr. Sheria Lang.  We suggested that she should visit Dr. Sheria Lang in late fall in 2014 (November 2014) and we will see her again in 01/05/14.  This will ensure the patient is seen every 6 months by a physician.  We plan to see the patient again in one  year at which time we will check a CBC, CMP, LDH, and vitamin D level.  All questions were answered.  The patient was encouraged to contact us in the interim with any problems, questions or concerns.   Larina Bras, NP-C 01/01/2013, 10:33 AM

## 2012-12-31 NOTE — Telephone Encounter (Signed)
, °

## 2013-01-01 ENCOUNTER — Ambulatory Visit: Payer: Self-pay | Admitting: Family

## 2013-01-01 ENCOUNTER — Ambulatory Visit: Payer: BC Managed Care – PPO | Admitting: Oncology

## 2013-01-01 DIAGNOSIS — R232 Flushing: Secondary | ICD-10-CM | POA: Insufficient documentation

## 2013-01-01 DIAGNOSIS — N898 Other specified noninflammatory disorders of vagina: Secondary | ICD-10-CM | POA: Insufficient documentation

## 2013-01-01 DIAGNOSIS — M8580 Other specified disorders of bone density and structure, unspecified site: Secondary | ICD-10-CM | POA: Insufficient documentation

## 2013-03-05 ENCOUNTER — Other Ambulatory Visit: Payer: Self-pay | Admitting: Medical Oncology

## 2013-03-05 DIAGNOSIS — C50919 Malignant neoplasm of unspecified site of unspecified female breast: Secondary | ICD-10-CM

## 2013-03-05 MED ORDER — LETROZOLE 2.5 MG PO TABS: 2.5000 mg | ORAL_TABLET | Freq: Every day | ORAL | Status: DC

## 2013-03-05 NOTE — Telephone Encounter (Signed)
Refill done x 11

## 2013-04-23 ENCOUNTER — Encounter (INDEPENDENT_AMBULATORY_CARE_PROVIDER_SITE_OTHER): Payer: Self-pay

## 2013-06-24 ENCOUNTER — Other Ambulatory Visit: Payer: Self-pay

## 2013-08-19 HISTORY — PX: COLONOSCOPY: SHX174

## 2013-12-15 ENCOUNTER — Telehealth: Payer: Self-pay | Admitting: *Deleted

## 2013-12-15 NOTE — Telephone Encounter (Signed)
Called pt to r/s f/u appt with Dr. Jana Hakim d/t he is in [facility].  Pt r/s with Dr. Andria Frames on 12/29/13 at 3:30pm.  Confirmed new appt date and time.  Pt denies needs or concerns.

## 2013-12-21 ENCOUNTER — Other Ambulatory Visit: Payer: Self-pay | Admitting: *Deleted

## 2013-12-21 DIAGNOSIS — C50919 Malignant neoplasm of unspecified site of unspecified female breast: Secondary | ICD-10-CM

## 2013-12-21 DIAGNOSIS — M8580 Other specified disorders of bone density and structure, unspecified site: Secondary | ICD-10-CM

## 2013-12-22 ENCOUNTER — Other Ambulatory Visit (HOSPITAL_BASED_OUTPATIENT_CLINIC_OR_DEPARTMENT_OTHER): Payer: BC Managed Care – PPO

## 2013-12-22 DIAGNOSIS — M8580 Other specified disorders of bone density and structure, unspecified site: Secondary | ICD-10-CM

## 2013-12-22 DIAGNOSIS — C50519 Malignant neoplasm of lower-outer quadrant of unspecified female breast: Secondary | ICD-10-CM

## 2013-12-22 DIAGNOSIS — C50919 Malignant neoplasm of unspecified site of unspecified female breast: Secondary | ICD-10-CM

## 2013-12-22 LAB — CBC WITH DIFFERENTIAL/PLATELET
BASO%: 1.4 % (ref 0.0–2.0)
Basophils Absolute: 0.1 10*3/uL (ref 0.0–0.1)
EOS%: 2.3 % (ref 0.0–7.0)
Eosinophils Absolute: 0.2 10*3/uL (ref 0.0–0.5)
HCT: 38.1 % (ref 34.8–46.6)
HGB: 12.5 g/dL (ref 11.6–15.9)
LYMPH#: 1.6 10*3/uL (ref 0.9–3.3)
LYMPH%: 24.7 % (ref 14.0–49.7)
MCH: 28.8 pg (ref 25.1–34.0)
MCHC: 32.9 g/dL (ref 31.5–36.0)
MCV: 87.5 fL (ref 79.5–101.0)
MONO#: 0.8 10*3/uL (ref 0.1–0.9)
MONO%: 11.5 % (ref 0.0–14.0)
NEUT%: 60.1 % (ref 38.4–76.8)
NEUTROS ABS: 3.9 10*3/uL (ref 1.5–6.5)
Platelets: 217 10*3/uL (ref 145–400)
RBC: 4.35 10*6/uL (ref 3.70–5.45)
RDW: 13.2 % (ref 11.2–14.5)
WBC: 6.6 10*3/uL (ref 3.9–10.3)

## 2013-12-22 LAB — COMPREHENSIVE METABOLIC PANEL (CC13)
ALBUMIN: 3.6 g/dL (ref 3.5–5.0)
ALT: 56 U/L — ABNORMAL HIGH (ref 0–55)
ANION GAP: 13 meq/L — AB (ref 3–11)
AST: 59 U/L — ABNORMAL HIGH (ref 5–34)
Alkaline Phosphatase: 167 U/L — ABNORMAL HIGH (ref 40–150)
BILIRUBIN TOTAL: 0.38 mg/dL (ref 0.20–1.20)
BUN: 25.7 mg/dL (ref 7.0–26.0)
CO2: 25 meq/L (ref 22–29)
Calcium: 9.5 mg/dL (ref 8.4–10.4)
Chloride: 101 mEq/L (ref 98–109)
Creatinine: 0.9 mg/dL (ref 0.6–1.1)
GLUCOSE: 105 mg/dL (ref 70–140)
POTASSIUM: 3.9 meq/L (ref 3.5–5.1)
SODIUM: 140 meq/L (ref 136–145)
TOTAL PROTEIN: 7.2 g/dL (ref 6.4–8.3)

## 2013-12-23 LAB — VITAMIN D 25 HYDROXY (VIT D DEFICIENCY, FRACTURES): VIT D 25 HYDROXY: 59 ng/mL (ref 30–89)

## 2013-12-29 ENCOUNTER — Ambulatory Visit: Payer: BC Managed Care – PPO | Admitting: Oncology

## 2013-12-29 ENCOUNTER — Ambulatory Visit (HOSPITAL_BASED_OUTPATIENT_CLINIC_OR_DEPARTMENT_OTHER): Payer: BC Managed Care – PPO | Admitting: Hematology and Oncology

## 2013-12-29 ENCOUNTER — Telehealth: Payer: Self-pay | Admitting: Oncology

## 2013-12-29 VITALS — BP 129/71 | HR 55 | Temp 98.2°F | Resp 18 | Ht 67.0 in | Wt 193.3 lb

## 2013-12-29 DIAGNOSIS — N898 Other specified noninflammatory disorders of vagina: Secondary | ICD-10-CM

## 2013-12-29 DIAGNOSIS — M949 Disorder of cartilage, unspecified: Secondary | ICD-10-CM

## 2013-12-29 DIAGNOSIS — N951 Menopausal and female climacteric states: Secondary | ICD-10-CM

## 2013-12-29 DIAGNOSIS — R232 Flushing: Secondary | ICD-10-CM

## 2013-12-29 DIAGNOSIS — M899 Disorder of bone, unspecified: Secondary | ICD-10-CM

## 2013-12-29 DIAGNOSIS — M8580 Other specified disorders of bone density and structure, unspecified site: Secondary | ICD-10-CM

## 2013-12-29 DIAGNOSIS — C50919 Malignant neoplasm of unspecified site of unspecified female breast: Secondary | ICD-10-CM

## 2013-12-29 DIAGNOSIS — N9489 Other specified conditions associated with female genital organs and menstrual cycle: Secondary | ICD-10-CM

## 2013-12-29 DIAGNOSIS — Z170 Estrogen receptor positive status [ER+]: Secondary | ICD-10-CM

## 2013-12-29 DIAGNOSIS — C50519 Malignant neoplasm of lower-outer quadrant of unspecified female breast: Secondary | ICD-10-CM

## 2013-12-29 NOTE — Progress Notes (Signed)
[facility]  Telephone:(336) [phone] [phone]  OFFICE PROGRESS NOTE   ID: AHMAYA OSTERMILLER   DOB: 1956/07/01  MR#: [id number]  POE#:[id number]   PCP: Carlus Pavlov MD (Tia Alert, [place]) GYN: Kimberlee Nearing, M.D. [place], [place]) SU: Erroll Luna, M.D.  Chief complaint: Follow up visit for breast cancer  HISTORY OF PRESENT ILLNESS: From Dr. Collier Salina Rubin's new patient evaluation note dated 05/10/2009:  "This is a 58 year old woman here today with her husband, [name], for evaluation and discussion of recent diagnosis of breast cancer.  This woman has a mammogram every year.  She had a shorter follow-up mammogram because of a concern that there was a palpable thickening in her breast.  She subsequently had a follow-up mammogram on 03/20/2009 that showed a 1.9 cm mass anterior third of the right breast.  Biopsy was recommended.  Biopsy on 03/22/2009 showed invasive ductal cancer which appeared to be low grade.  ER/PR positive at 90 and 88% respectively, proliferative index 15%. HER-2 was not over amplified, ratio 1.19.  Preoperative MRI scan was performed and showed a solitary 1.7 cm regular mass in the lower outer quadrant of the right breast. The patient elects to undergo lumpectomy with sentinel lymph node evaluation 04/11/2009.  A 1.6 cm invasive ductal cancer was removed.  This had an overall histologic grade of 1 of 3.  Six lymph nodes were removed and these were all negative.  Margins were minimally invasive at 3 mm.  The patient has had an unremarkable postoperative course."  Her subsequent history is as detailed below.   INTERVAL HISTORY: Pat is a 30 his old present to female with history of breast cancer is here for followup visit. She says that she's been tolerating letrozole well with minimal ide effects of hot flashes and vaginal dryness. She gets pelvic exam/ Pap smear regularly and she is due to get one this year. She says that  her liver enzymes usually  waxes and wanes and  is closely monitored by the primary care physician.   she took alcohol  end of April around the time of her daughter's wedding. Her last mammogram was in September 2014 that revealed no evidence of any malignancy She continued to do self breast exams, exercises regularly and follows healthy diet  REVIEW OF SYSTEMS: A 10 point review of systems was completed and pertinent symptoms as mentioned in interval history   PAST MEDICAL HISTORY: Past Medical History  Diagnosis Date  . Hypertension   . Hyperlipidemia   . Cancer     rt breast    PAST SURGICAL HISTORY: Past Surgical History  Procedure Laterality Date  . Tubal ligation    . Cholecystectomy  2005  . Breast lumpectomy  2010    right    FAMILY HISTORY Family History  Problem Relation Age of Onset  . Cancer Father     kidney  . Heart disease Father   . Hyperlipidemia Mother   . Diabetes Mother   Father is deceased from ureteral cancer.  Mother is alive and well.  She has two brothers who are alive and well.    GYNECOLOGIC HISTORY: G2 P2.  Menarche at age 57, menopause at age 72.  On Prempro for 4-5 years.    SOCIAL HISTORY: Mrs. Horney has been married to her husband [name] for over 30 years.  They live in [place], [place].  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  Husband works as a Dealer for the  [employer] in [place] but he is currently out on disability related to [employer].  They have two adult daughters. One lives in [place] and one lives in [place].  They have no grandchildren.  In her spare time the patient enjoys going to the beach, traveling, spending time with her family, working on the computer, doing Barista, and gardening.   ADVANCED DIRECTIVES: Not on file  HEALTH MAINTENANCE: History  Substance Use Topics  . Smoking status: Former Research scientist (life sciences)  . Smokeless tobacco: Never Used     Comment: Quit over 25 years ago  . Alcohol Use: Yes     Comment:  Occasional    Colonoscopy: Not on file PAP: Not on file Bone density: The patient's last bone density examination on 04/15/2012 showed a T score of -1.5 (osteopenia). Lipid panel: Not on file  No Known Allergies  Current Outpatient Prescriptions  Medication Sig Dispense Refill  . aspirin 81 MG tablet Take 81 mg by mouth daily.       . Calcium Carbonate-Vitamin D (CALTRATE 600+D PO) Take by mouth 2 (two) times daily.      . Cholecalciferol (VITAMIN D-3) 1000 UNITS CAPS Take 2 capsules by mouth daily. 2000 IUs daily      . letrozole (FEMARA) 2.5 MG tablet Take 1 tablet (2.5 mg total) by mouth daily.  30 tablet  11  . Omega-3 Fatty Acids (FISH OIL) 1200 MG CAPS Take 1,200 mg by mouth daily. Krill oil      . valsartan-hydrochlorothiazide (DIOVAN-HCT) 160-12.5 MG per tablet Take 1 tablet by mouth daily.       No current facility-administered medications for this visit.    OBJECTIVE: Filed Vitals:   12/29/13 1525  BP: 129/71  Pulse: 55  Temp: 98.2 F (36.8 C)  Resp: 18     Body mass index is 30.27 kg/(m^2).      ECOG FS: 1 - Symptomatic but completely ambulatory  General appearance: Alert, cooperative, well nourished, no apparent distress Head: Normocephalic, without obvious abnormality, atraumatic Eyes: Conjunctivae/corneas clear, PERRLA, EOMI Nose: Nares, septum and mucosa are normal, no drainage or sinus tenderness Neck: No adenopathy, supple, symmetrical, trachea midline, thyroid not enlarged, no tenderness Resp: Clear to auscultation bilaterally Cardio: Regular rate and rhythm, S1, S2 normal, no murmur, click, rub or gallop Breasts: Glandular breast tissue bilaterally, right breast has architectural and radiation changes noted in addition to well-healed surgical scars, bilateral firm medial/inframammary ridge,  prominent right breast scar tissue noted, no nipple inversion, bilateral axillary fullness, striae on breasts bilaterally GI: Soft, distended, non-tender, hypoactive  bowel sounds, no organomegaly, well-healed abdominal surgical scar Extremities: Extremities normal, atraumatic, no cyanosis or edema Lymph nodes: Cervical and supraclavicular nodes are normal Neurologic: Grossly normal   LAB RESULTS: Lab Results  Component Value Date   WBC 6.6 12/22/2013   NEUTROABS 3.9 12/22/2013   HGB 12.5 12/22/2013   HCT 38.1 12/22/2013   MCV 87.5 12/22/2013   PLT 217 12/22/2013      Chemistry      Component Value Date/Time   NA 140 12/22/2013 1558   NA 139 10/30/2011 1000   K 3.9 12/22/2013 1558   K 4.4 10/30/2011 1000   CL 99 12/25/2012 0912   CL 99 10/30/2011 1000   CO2 25 12/22/2013 1558   CO2 30 10/30/2011 1000   BUN 25.7 12/22/2013 1558   BUN 25* 10/30/2011 1000   CREATININE 0.9 12/22/2013 1558   CREATININE 0.95 10/30/2011 1000      Component  Value Date/Time   CALCIUM 9.5 12/22/2013 1558   CALCIUM 9.4 10/30/2011 1000   ALKPHOS 167* 12/22/2013 1558   ALKPHOS 145* 10/30/2011 1000   AST 59* 12/22/2013 1558   AST 24 10/30/2011 1000   ALT 56* 12/22/2013 1558   ALT 25 10/30/2011 1000   BILITOT 0.38 12/22/2013 1558   BILITOT 0.4 10/30/2011 1000      Lab Results  Component Value Date   LABCA2 26 05/27/2012    Urinalysis No results found for this basename: colorurine,  appearanceur,  labspec,  phurine,  glucoseu,  hgbur,  bilirubinur,  ketonesur,  proteinur,  urobilinogen,  nitrite,  leukocytesur     ASSESSMENT: 58 y.o. [place], [place] woman:  1.  Status post right breast needle core biopsy on 03/22/2009 which showed invasive ductal carcinoma, ER 98%, PR 80%, Ki-67 15%, HER-2/neu by CISH no amplification with a ratio of 1.19.  2.  Status post bilateral breast MRI on 04/05/2009 which showed a moderate background parenchymal enhancement bilaterally, with numerous scattered foci of enhancement in both breasts.  There is a dominant irregular enhancing mass in the lower outer quadrant of the right breast that measures 1.6 x 1.6 x 1.7 cm.  A 4 mm inframammary lymph node is  visualized 2 cm directly posterior to the mass aside from a moderate background enhancement pattern of the left breast, no dominant area of enhancement is identified in the left breast to suggest malignancy.  No enlarged or suspicious axillary or internal mammary chain lymph nodes were identified.  3.  Status post right breast needle localized lumpectomy with sentinel node biopsy and additional medial margins on 04/11/2009 which showed a stage I, pT1c pN0, 1.6 cm invasive and in situ ductal carcinoma, grade 1, ER 90%, PR 88%, Ki-67 15%, HER-2/neu by CISH no amplification with a ratio 1.19, 0/6 positive lymph nodes.  Oncotype DX performed showed a breast cancer recurrence score of 12% with an average rate of distant recurrence at 8%.  4.  Status post radiation therapy completed in 06/2009 in [place], [place].  5.  According to Dr. Julien Girt notes the patient started antiestrogen therapy with Letrozole 2.5 mg by mouth daily in 06/2009 or 07/2009, however, she does not think she started antiestrogen therapy with Letrozole until 10/2009.  6.  The patient's last bone density scan on 04/15/2012 showed a T score of -1.5 (osteopenia).  7.  Hot flashes and vaginal dryness.  PLAN: Nara tolerating letrozole very well with minimal [name] effects of hot flashes and vaginal dryness. She will continue antiestrogen therapy with Letrozole 2.5 mg by mouth daily until 10/2014.   I have reviewed her last mammogram report which was done in September 2014 that revealed scattered parenchymal densities which are stable with no new or worrisome findings and was recommended to have diagnostic mammogram in one year.  She does have fluctuating elevated liver functions. I've asked the patient to abstain from alcohol and to have repeat CMP done in one month. She says she sees her primary care physician regularly and she would like to have her liver functions performed in primary care's office. If her liver functions continued  to raise recommend CT of the abdomen to rule out any liver lesions.  Continue Caltrate 1200 mg by mouth daily and vitamin D 3 2000 IUs daily for osteopenia. We will arrange for her repeat bone density in September 2015 around the same time as her mammogram  She feels better with  Replens use  for her  vaginal dryness.    We plan to see the patient again in one year at which time we will check a CBC, CMP.  All questions were answered.  The patient was encouraged to contact us in the interim with any problems, questions or concerns.   Wilmon Arms, M.D. Medical oncology  12/29/2013, 4:30 PM

## 2014-03-15 ENCOUNTER — Other Ambulatory Visit: Payer: Self-pay | Admitting: *Deleted

## 2014-03-15 DIAGNOSIS — C50919 Malignant neoplasm of unspecified site of unspecified female breast: Secondary | ICD-10-CM

## 2014-03-15 MED ORDER — LETROZOLE 2.5 MG PO TABS: 2.5000 mg | ORAL_TABLET | Freq: Every day | ORAL | Status: DC

## 2014-12-27 ENCOUNTER — Other Ambulatory Visit: Payer: BC Managed Care – PPO

## 2014-12-28 ENCOUNTER — Other Ambulatory Visit: Payer: Self-pay | Admitting: *Deleted

## 2014-12-28 DIAGNOSIS — C50919 Malignant neoplasm of unspecified site of unspecified female breast: Secondary | ICD-10-CM

## 2014-12-29 ENCOUNTER — Other Ambulatory Visit (HOSPITAL_BASED_OUTPATIENT_CLINIC_OR_DEPARTMENT_OTHER): Payer: BLUE CROSS/BLUE SHIELD

## 2014-12-29 DIAGNOSIS — Z853 Personal history of malignant neoplasm of breast: Secondary | ICD-10-CM | POA: Diagnosis not present

## 2014-12-29 DIAGNOSIS — C50919 Malignant neoplasm of unspecified site of unspecified female breast: Secondary | ICD-10-CM

## 2014-12-29 LAB — COMPREHENSIVE METABOLIC PANEL (CC13)
ALBUMIN: 3.3 g/dL — AB (ref 3.5–5.0)
ALK PHOS: 110 U/L (ref 40–150)
ALT: 20 U/L (ref 0–55)
AST: 24 U/L (ref 5–34)
Anion Gap: 7 mEq/L (ref 3–11)
BUN: 17.6 mg/dL (ref 7.0–26.0)
CALCIUM: 8.8 mg/dL (ref 8.4–10.4)
CHLORIDE: 106 meq/L (ref 98–109)
CO2: 27 mEq/L (ref 22–29)
Creatinine: 0.8 mg/dL (ref 0.6–1.1)
EGFR: 88 mL/min/{1.73_m2} — ABNORMAL LOW (ref >90)
Glucose: 90 mg/dl (ref 70–140)
Potassium: 4.2 mEq/L (ref 3.5–5.1)
SODIUM: 140 meq/L (ref 136–145)
TOTAL PROTEIN: 6.6 g/dL (ref 6.4–8.3)
Total Bilirubin: 0.46 mg/dL (ref 0.20–1.20)

## 2014-12-29 LAB — CBC WITH DIFFERENTIAL/PLATELET
BASO%: 1.2 % (ref 0.0–2.0)
Basophils Absolute: 0.1 10*3/uL (ref 0.0–0.1)
EOS ABS: 0.1 10*3/uL (ref 0.0–0.5)
EOS%: 3.2 % (ref 0.0–7.0)
HCT: 35.5 % (ref 34.8–46.6)
HGB: 11.5 g/dL — ABNORMAL LOW (ref 11.6–15.9)
LYMPH#: 1.6 10*3/uL (ref 0.9–3.3)
LYMPH%: 39.9 % (ref 14.0–49.7)
MCH: 28.6 pg (ref 25.1–34.0)
MCHC: 32.4 g/dL (ref 31.5–36.0)
MCV: 88.3 fL (ref 79.5–101.0)
MONO#: 0.4 10*3/uL (ref 0.1–0.9)
MONO%: 9.5 % (ref 0.0–14.0)
NEUT%: 46.2 % (ref 38.4–76.8)
NEUTROS ABS: 1.9 10*3/uL (ref 1.5–6.5)
Platelets: 203 10*3/uL (ref 145–400)
RBC: 4.02 10*6/uL (ref 3.70–5.45)
RDW: 12.7 % (ref 11.2–14.5)
WBC: 4 10*3/uL (ref 3.9–10.3)

## 2015-01-03 ENCOUNTER — Ambulatory Visit (HOSPITAL_BASED_OUTPATIENT_CLINIC_OR_DEPARTMENT_OTHER): Payer: BLUE CROSS/BLUE SHIELD | Admitting: Oncology

## 2015-01-03 VITALS — BP 133/58 | HR 65 | Temp 97.3°F | Resp 18 | Ht 67.0 in | Wt 212.7 lb

## 2015-01-03 DIAGNOSIS — Z853 Personal history of malignant neoplasm of breast: Secondary | ICD-10-CM | POA: Diagnosis not present

## 2015-01-03 DIAGNOSIS — C50911 Malignant neoplasm of unspecified site of right female breast: Secondary | ICD-10-CM

## 2015-01-03 DIAGNOSIS — M8580 Other specified disorders of bone density and structure, unspecified site: Secondary | ICD-10-CM

## 2015-01-03 NOTE — Progress Notes (Signed)
[name]  [phone] [phone]  OFFICE PROGRESS NOTE   ID: [name]   DOB: [birth year]  MR#: [id number]  BEM#:[id number]   PCP: Yvonne Pavlov MD ([place]ia [place], [place]) GYN: Kimberlee Nearing, M.D. [place], [place]) SU: Erroll Luna, M.D.  Chief complaint:  Estrogen receptor positive breast cancer   Current treatment: Completing 5 years of letrozole  HISTORY OF PRESENT ILLNESS: From Dr. Collier Salina Rubin's new patient evaluation note dated 05/10/2009:  "This is a 59 year old woman here today with her husband, [name], for evaluation and discussion of recent diagnosis of breast cancer.  This woman has a mammogram every year.  She had a shorter follow-up mammogram because of a concern that there was a palpable thickening in her breast.  She subsequently had a follow-up mammogram on 03/20/2009 that showed a 1.9 cm mass anterior third of the right breast.  Biopsy was recommended.  Biopsy on 03/22/2009 showed invasive ductal cancer which appeared to be low grade.  ER/PR positive at 90 and 88% respectively, proliferative index 15%. HER-2 was not over amplified, ratio 1.19.  Preoperative MRI scan was performed and showed a solitary 1.7 cm regular mass in the lower outer quadrant of the right breast. The patient elects to undergo lumpectomy with sentinel lymph node evaluation 04/11/2009.  A 1.6 cm invasive ductal cancer was removed.  This had an overall histologic grade of 1 of 3.  Six lymph nodes were removed and these were all negative.  Margins were minimally invasive at 3 mm.  The patient has had an unremarkable postoperative course."  Her subsequent history is as detailed below.   INTERVAL HISTORY: [place] today for follow-up of her  Estrogen receptor positive breast cancer. She completes 5 years of letrozole now. She has tolerated this well, with mild hot flashes and  Moderate vaginal dryness as the chief [name] effects. She obtain the drug at a very good  price  REVIEW OF SYSTEMS:  her mother died a week ago at age 74 years after a stroke. This happened rather suddenly, secondary to a urinary tract infection. [name] is appropriately grieving. She has some knee pain, and is not exercising regularly as a result. Otherwise a detailed review of systems today was noncontributory   PAST MEDICAL HISTORY: Past Medical History  Diagnosis Date  . Hypertension   . Hyperlipidemia   . Cancer     rt breast    PAST SURGICAL HISTORY: Past Surgical History  Procedure Laterality Date  . Tubal ligation    . Cholecystectomy  2005  . Breast lumpectomy  2010    right    FAMILY HISTORY Family History  Problem Relation Age of Onset  . Cancer Father     kidney  . Heart disease Father   . Hyperlipidemia Mother   . Diabetes Mother   Father is deceased from ureteral cancer.  Mother is alive and well.  She has two brothers who are alive and well.    GYNECOLOGIC HISTORY: G2 P2.  Menarche at age 39, menopause at age 10.  On Prempro for 4-5 years.    SOCIAL HISTORY: [name] has been married to her husband [name] for over 30 years.  They live in [place], [place].  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  Husband works as a Dealer for the [employer] in [place] but he is currently out on disability related to [employer].  They have two adult daughters. One lives in [place]  and one lives in [place].  They have no grandchildren.  In her spare time the patient enjoys going to the beach, traveling, spending time with her family, working on the computer, doing Barista, and gardening.   ADVANCED DIRECTIVES: Not on file  HEALTH MAINTENANCE: History  Substance Use Topics  . Smoking status: Former Research scientist (life sciences)  . Smokeless tobacco: Never Used     Comment: Quit over 25 years ago  . Alcohol Use: Yes     Comment: Occasional    Colonoscopy:  2016; "clean" PAP:  Bone density:  September 2015/osteopenia Lipid panel: Not on  file  No Known Allergies  Current Outpatient Prescriptions  Medication Sig Dispense Refill  . aspirin 81 MG tablet Take 81 mg by mouth daily.     . Calcium Carbonate-Vitamin D (CALTRATE 600+D PO) Take by mouth 2 (two) times daily.    . Cholecalciferol (VITAMIN D-3) 1000 UNITS CAPS Take 2 capsules by mouth daily. 2000 IUs daily    . letrozole (FEMARA) 2.5 MG tablet Take 1 tablet (2.5 mg total) by mouth daily. 30 tablet 9  . Omega-3 Fatty Acids (FISH OIL) 1200 MG CAPS Take 1,200 mg by mouth daily. Krill oil    . valsartan-hydrochlorothiazide (DIOVAN-HCT) 160-12.5 MG per tablet Take 1 tablet by mouth daily.     No current facility-administered medications for this visit.    OBJECTIVE:  Middle-aged white woman in no acute distress Filed Vitals:   01/03/15 1550  BP: 133/58  Pulse: 65  Temp: 97.3 F (36.3 C)  Resp: 18     Body mass index is 33.31 kg/(m^2).      ECOG FS: 0 - Asymptomatic  Sclerae unicteric, pupils equal and reactive Oropharynx clear and moist-- no thrush No cervical or supraclavicular adenopathy Lungs no rales or rhonchi Heart regular rate and rhythm Abd soft, nontender, positive bowel sounds MSK no focal spinal tenderness, no upper extremity lymphedema Neuro: nonfocal, well oriented, appropriate affect Breasts:  The right breast is status post lumpectomy and radiation. There is no evidence of local recurrence. The right axilla is benign. The left breast is unremarkable.   LAB RESULTS: Lab Results  Component Value Date   WBC 4.0 12/29/2014   NEUTROABS 1.9 12/29/2014   HGB 11.5* 12/29/2014   HCT 35.5 12/29/2014   MCV 88.3 12/29/2014   PLT 203 12/29/2014      Chemistry      Component Value Date/Time   NA 140 12/29/2014 1026   NA 139 10/30/2011 1000   K 4.2 12/29/2014 1026   K 4.4 10/30/2011 1000   CL 99 12/25/2012 0912   CL 99 10/30/2011 1000   CO2 27 12/29/2014 1026   CO2 30 10/30/2011 1000   BUN 17.6 12/29/2014 1026   BUN 25* 10/30/2011 1000    CREATININE 0.8 12/29/2014 1026   CREATININE 0.95 10/30/2011 1000      Component Value Date/Time   CALCIUM 8.8 12/29/2014 1026   CALCIUM 9.4 10/30/2011 1000   ALKPHOS 110 12/29/2014 1026   ALKPHOS 145* 10/30/2011 1000   AST 24 12/29/2014 1026   AST 24 10/30/2011 1000   ALT 20 12/29/2014 1026   ALT 25 10/30/2011 1000   BILITOT 0.46 12/29/2014 1026   BILITOT 0.4 10/30/2011 1000      Lab Results  Component Value Date   LABCA2 26 05/27/2012    Urinalysis No results found for: COLORURINE  STUDIES:  Mammography at [facility] with tomography 04/27/2014 unremarkable. DEXA scan the same day at [facility] showed  osteopenia with a T score of -1.7  ASSESSMENT: 59 y.o. [place], [place] woman:  1.  Status post right breast needle core biopsy on 03/22/2009 which showed invasive ductal carcinoma, ER 98%, PR 80%, Ki-67 15%, HER-2/neu by CISH no amplification with a ratio of 1.19.  2.  Status post bilateral breast MRI on 04/05/2009 which showed a moderate background parenchymal enhancement bilaterally, with numerous scattered foci of enhancement in both breasts.  There is a dominant irregular enhancing mass in the lower outer quadrant of the right breast that measures 1.6 x 1.6 x 1.7 cm.  A 4 mm inframammary lymph node is visualized 2 cm directly posterior to the mass aside from a moderate background enhancement pattern of the left breast, no dominant area of enhancement is identified in the left breast to suggest malignancy.  No enlarged or suspicious axillary or internal mammary chain lymph nodes were identified.  3.  Status post right breast needle localized lumpectomy with sentinel node biopsy and additional medial margins on 04/11/2009 which showed a stage I, pT1c pN0, 1.6 cm invasive and in situ ductal carcinoma, grade 1, ER 90%, PR 88%, Ki-67 15%, HER-2/neu by CISH no amplification with a ratio 1.19, 0/6 positive lymph nodes.  Oncotype DX performed showed a breast cancer recurrence score of 12%  with an average rate of distant recurrence at 8%.  4.  Status post radiation therapy completed in 06/2009 in [place], [place].  5.  According to Dr. Julien Girt notes the patient started antiestrogen therapy with Letrozole 2.5 mg by mouth daily in 06/2009 or 07/2009, however, she does not think she started antiestrogen therapy with Letrozole until 10/2009.  6.  The patient's  Most recent bone density scan September 2015 showed a T score of -1.7 (osteopenia).   PLAN: Angelee  Has completed a little over 5 years of letrozole. We discussed the fact that the data on this medication essentially stops at 5 years. While a study  Comparing 5 versus 10 years of letrozole close several years ago, it has not yet been reported. Accordingly my practice is to stop aromatase inhibitors at 5 years, especially in patients who are node-negative and have a good prognosis, like  [name].   Accordingly I am releasing her back to her primary care physician. All she will need is yearly mammography , preferably with "3-D" tomography, and a yearly physician breast exam. Of course we will be glad to see [name] at any point in the future if the need arises, but as of now we are making no further routine appointments for her here.   Chauncey Cruel, MD  Medical oncology  01/03/2015, 4:13 PM

## 2016-10-08 DIAGNOSIS — Z853 Personal history of malignant neoplasm of breast: Secondary | ICD-10-CM

## 2016-10-08 DIAGNOSIS — I10 Essential (primary) hypertension: Secondary | ICD-10-CM

## 2016-10-08 DIAGNOSIS — M8580 Other specified disorders of bone density and structure, unspecified site: Secondary | ICD-10-CM

## 2016-10-08 HISTORY — DX: Other specified disorders of bone density and structure, unspecified site: M85.80

## 2016-10-08 HISTORY — DX: Essential (primary) hypertension: I10

## 2016-10-08 HISTORY — DX: Personal history of malignant neoplasm of breast: Z85.3

## 2019-10-16 ENCOUNTER — Ambulatory Visit: Payer: BC Managed Care – PPO | Attending: Internal Medicine

## 2019-10-16 DIAGNOSIS — Z23 Encounter for immunization: Secondary | ICD-10-CM | POA: Insufficient documentation

## 2019-10-16 NOTE — Progress Notes (Signed)
   Covid-19 Vaccination Clinic  Name:  MONTSERRAT GRZESIK    MRN: [id number] DOB: [birth year]  10/16/2019  Ms. Looper was observed post Covid-19 immunization for 15 minutes without incidence. She was provided with Vaccine Information Sheet and instruction to access the V-Safe system.   Ms. Scipio was instructed to call 911 with any severe reactions post vaccine: Marland Kitchen Difficulty breathing  . Swelling of your face and throat  . A fast heartbeat  . A bad rash all over your body  . Dizziness and weakness    Immunizations Administered    Name Date Dose VIS Date Route   Pfizer COVID-19 Vaccine 10/16/2019  1:59 PM 0.3 mL 07/30/2019 Intramuscular   Manufacturer: [place]   Lot: [id number]   [place]: [id number]

## 2019-11-06 ENCOUNTER — Ambulatory Visit: Payer: BC Managed Care – PPO | Attending: Internal Medicine

## 2019-11-06 DIAGNOSIS — Z23 Encounter for immunization: Secondary | ICD-10-CM

## 2019-11-06 NOTE — Progress Notes (Signed)
   Covid-19 Vaccination Clinic  Name:  NAVYA NELTON    MRN: [id number] DOB: 04-12-1956  11/06/2019  Ms. Bodenheimer was observed post Covid-19 immunization for 15 minutes without incident. She was provided with Vaccine Information Sheet and instruction to access the V-Safe system.   Ms. Guerrette was instructed to call 911 with any severe reactions post vaccine: Marland Kitchen Difficulty breathing  . Swelling of face and throat  . A fast heartbeat  . A bad rash all over body  . Dizziness and weakness   Immunizations Administered    Name Date Dose VIS Date Route   Pfizer COVID-19 Vaccine 11/06/2019 10:10 AM 0.3 mL 07/30/2019 Intramuscular   Manufacturer: [place]   Lot: [id number]   [place]: [id number]

## 2021-08-28 ENCOUNTER — Encounter: Payer: Self-pay | Admitting: Cardiology

## 2021-08-28 ENCOUNTER — Encounter: Payer: Self-pay | Admitting: *Deleted

## 2021-09-25 ENCOUNTER — Ambulatory Visit: Payer: BC Managed Care – PPO | Admitting: Cardiology

## 2021-10-17 ENCOUNTER — Ambulatory Visit: Payer: BC Managed Care – PPO | Admitting: Cardiology

## 2021-10-17 ENCOUNTER — Encounter: Payer: Self-pay | Admitting: Cardiology

## 2021-10-17 ENCOUNTER — Other Ambulatory Visit: Payer: Self-pay

## 2021-10-17 VITALS — BP 134/78 | HR 62 | Ht 66.5 in | Wt 206.6 lb

## 2021-10-17 DIAGNOSIS — I10 Essential (primary) hypertension: Secondary | ICD-10-CM | POA: Diagnosis not present

## 2021-10-17 DIAGNOSIS — R0789 Other chest pain: Secondary | ICD-10-CM | POA: Diagnosis not present

## 2021-10-17 DIAGNOSIS — R079 Chest pain, unspecified: Secondary | ICD-10-CM

## 2021-10-17 DIAGNOSIS — E785 Hyperlipidemia, unspecified: Secondary | ICD-10-CM | POA: Diagnosis not present

## 2021-10-17 MED ORDER — ASPIRIN EC 81 MG PO TBEC
81.0000 mg | DELAYED_RELEASE_TABLET | Freq: Every day | ORAL | 3 refills | Status: AC
Start: 2021-10-17 — End: ?

## 2021-10-17 MED ORDER — METOPROLOL TARTRATE 50 MG PO TABS: 50.0000 mg | ORAL_TABLET | Freq: Once | ORAL | 0 refills | Status: DC

## 2021-10-17 NOTE — Patient Instructions (Signed)
Medication Instructions:  ?Your physician has recommended you make the following change in your medication:  ? ?START: Aspirin 81 mg daily ? ?*If you need a refill on your cardiac medications before your next appointment, please call your pharmacy* ? ? ?Lab Work: ?Your physician recommends that you return for lab work in:  ? ?Labs today: Direct LDL ?BMP 1 week before CT ? ?If you have labs (blood work) drawn today and your tests are completely normal, you will receive your results only by: ?MyChart Message (if you have MyChart) OR ?A paper copy in the mail ?If you have any lab test that is abnormal or we need to change your treatment, we will call you to review the results. ? ? ?Testing/Procedures: ? ? ?Your cardiac CT will be scheduled at one of the below locations:  ? ?[facility] ?[address] ?[place], [place] [address] ?[phone]) [phone] ? ?OR ? ?[facility] ?[address] ?[address] ?[place],  [address] ?[phone] ?If scheduled at [facility], please arrive at the [facility] and Children's Entrance (Entrance C2) of [facility] 30 minutes prior to test start time. ?You can use the FREE valet parking offered at entrance C (encouraged to control the heart rate for the test)  ?Proceed to the [facility] Radiology Department (first floor) to check-in and test prep. ? ?All radiology patients and guests should use entrance C2 at [facility], accessed from [facility], even though the hospital's physical address listed is [address] ? ? ? ?If scheduled at [facility], please arrive 15 mins early for check-in and test prep. ? ?Please follow these instructions carefully (unless otherwise directed): ? ?On the Night Before the Test: ?Be sure to Drink plenty of water. ?Do not consume any caffeinated/decaffeinated beverages or chocolate 12 hours prior to your test. ?Do not take any antihistamines  12 hours prior to your test. ? ?On the Day of the Test: ?Drink plenty of water until 1 hour prior to the test. ?Do not eat any food 4 hours prior to the test. ?You may take your regular medications prior to the test.  ?Take metoprolol (Lopressor) two hours prior to test. ?Hold Diovan-HCT ?FEMALES- please wear underwire-free bra if available, avoid dresses & tight clothing ? ?     ?After the Test: ?Drink plenty of water. ?After receiving IV contrast, you may experience a mild flushed feeling. This is normal. ?On occasion, you may experience a mild rash up to 24 hours after the test. This is not dangerous. If this occurs, you can take Benadryl 25 mg and increase your fluid intake. ?If you experience trouble breathing, this can be serious. If it is severe call 911 IMMEDIATELY. If it is mild, please call our office. ? ? ?We will call to schedule your test 2-4 weeks out understanding that some insurance companies will need an authorization prior to the service being performed.  ? ?For non-scheduling related questions, please contact the cardiac imaging nurse navigator should you have any questions/concerns: ?Marchia Bond, Cardiac Imaging Nurse Navigator ?Gordy Clement, Cardiac Imaging Nurse Navigator ?[facility] ?Direct Office Dial: [phone]  ? ?For scheduling needs, including cancellations and rescheduling, please call [place], [phone]. ? ? ? ?Follow-Up: ?At [facility], you and your health needs are our priority.  As part of our continuing mission to provide you with exceptional heart care, we have created designated Provider Care Teams.  These  Care Teams include your primary Cardiologist (physician) and Advanced Practice Providers (APPs -  Physician Assistants and Nurse Practitioners) who all work together to provide you with the care you need, when you need it. ? ?We recommend signing up for the patient portal called "MyChart".  Sign up information is provided on this After  Visit Summary.  MyChart is used to connect with patients for Virtual Visits (Telemedicine).  Patients are able to view lab/test results, encounter notes, upcoming appointments, etc.  Non-urgent messages can be sent to your provider as well.   ?To learn more about what you can do with MyChart, go to [email or web address].   ? ?Your next appointment:   ?2 month(s) ? ?The format for your next appointment:   ?In Person ? ?Provider:   ?Jenne Campus, MD  ? ? ?Other Instructions ?None ? ?

## 2021-10-17 NOTE — Addendum Note (Signed)
Addended by: Edwyna Shell I on: 10/17/2021 04:45 PM ? ? Modules accepted: Orders ? ?

## 2021-10-17 NOTE — Progress Notes (Signed)
Ekg

## 2021-10-17 NOTE — Progress Notes (Signed)
? ?Cardiology Consultation:   ? ?Date:  10/17/2021  ? ?ID:  [name], DOB 15-Aug-1956, MRN [id number] ? ?PCP:  Carlus Pavlov, MD  ?Cardiologist:  Jenne Campus, MD  ? ?Referring MD: Carlus Pavlov, MD  ? ?Chief Complaint  ?Patient presents with  ? Chest Pain  ?  Ongoing sx's for few weeks  ? ? ?History of Present Illness:   ? ?[name] is a 66 y.o. female who is being seen today for the evaluation of chest pain at the request of Carlus Pavlov, MD. past medical history significant for essential hypertension, dyslipidemia.  She is taking care of her sick husband he did have urine bladder cancer recently he was found to have reactivation of the problem this is very stressful time for her.  She described the fact that sometimes when she gets very stressed out she will have chest pain.  She described pain as both sides of her chest both right and left.  There is no shortness of breath no sweating associated with this lasting for few minutes.  At the same time she does not have any exertional chest pain.  Overall she got alarmed when the pain happen since the first episode few weeks ago she had 2 additional episodes lasting for few minutes.  Since that time she is afraid to do things aggressively because of her concern of her chest pain. ?She never had any chest pain. ?She smokes only for few years when she was in her 12s ?She does have dyslipidemia as well as essential hypertension is a risk factors ?He is not on any special diet ? ?Past Medical History:  ?Diagnosis Date  ? Breast cancer, right breast ([place]) 12/31/2012  ? Chronic allergic rhinitis due to pollen   ? Essential hypertension 10/08/2016  ? Hyperlipidemia   ? Obesity   ? 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/2016  ? ? ?Past Surgical History:  ?Procedure Laterality Date  ? BREAST LUMPECTOMY  2010  ? right  ? CHOLECYSTECTOMY  2005  ? TUBAL LIGATION    ? ? ?Current Medications: ?Current Meds  ?Medication Sig  ? valsartan-hydrochlorothiazide (DIOVAN-HCT)  160-12.5 MG per tablet Take 1 tablet by mouth daily.  ?  ? ?Allergies:   Patient has no known allergies.  ? ?Social History  ? ?Socioeconomic History  ? Marital status: Married  ?  Spouse name: Not on file  ? Number of children: Not on file  ? Years of education: Not on file  ? Highest education level: Not on file  ?Occupational History  ? Not on file  ?Tobacco Use  ? Smoking status: Former  ? Smokeless tobacco: Never  ? Tobacco comments:  ?  Quit over 25 years ago  ?Substance and Sexual Activity  ? Alcohol use: Never  ?  Comment: Occasional  ? Drug use: No  ? Sexual activity: Yes  ?  Birth control/protection: Post-menopausal, Surgical  ?Other Topics Concern  ? Not on file  ?Social History Narrative  ? Not on file  ? ?Social Determinants of Health  ? ?Financial Resource Strain: Not on file  ?Food Insecurity: Not on file  ?Transportation Needs: Not on file  ?Physical Activity: Not on file  ?Stress: Not on file  ?Social Connections: Not on file  ?  ? ?Family History: ?The patient's family history includes Cancer in her father; Diabetes in her father and mother; Heart disease in her father; Hyperlipidemia in her mother; Hypertension in her mother. ?ROS:   ?Please see  the history of present illness.    ?All 14 point review of systems negative except as described per history of present illness. ? ?EKGs/Labs/Other Studies Reviewed:   ? ?The following studies were reviewed today: ? ? ?EKG:  EKG is  ordered today.  The ekg ordered today demonstrates normal sinus rhythm, normal P interval, normal QS complex duration fulgent no ST segment changes, right axis deviation ? ?Recent Labs: ?No results found for requested labs within last 8760 hours.  ?Recent Lipid Panel ?No results found for: CHOL, TRIG, HDL, CHOLHDL, VLDL, LDLCALC, LDLDIRECT ? ?Physical Exam:   ? ?VS:  BP 134/78 (BP Location: Right Arm, Patient Position: Sitting)   Pulse 62   Ht 5' 6.5" (1.689 m)   Wt 206 lb 9.6 oz (93.7 kg)   SpO2 98%   BMI 32.85 kg/m?     ? ?Wt Readings from Last 3 Encounters:  ?10/17/21 206 lb 9.6 oz (93.7 kg)  ?08/27/21 206 lb (93.4 kg)  ?01/03/15 212 lb 11.2 oz (96.5 kg)  ?  ? ?GEN:  Well nourished, well developed in no acute distress ?HEENT: Normal ?NECK: No JVD; No carotid bruits ?LYMPHATICS: No lymphadenopathy ?CARDIAC: RRR, no murmurs, no rubs, no gallops ?RESPIRATORY:  Clear to auscultation without rales, wheezing or rhonchi  ?ABDOMEN: Soft, non-tender, non-distended ?MUSCULOSKELETAL:  No edema; No deformity  ?SKIN: Warm and dry ?NEUROLOGIC:  Alert and oriented x 3 ?PSYCHIATRIC:  Normal affect  ? ?ASSESSMENT:   ? ?1. Essential hypertension   ?2. Atypical chest pain   ?3. Dyslipidemia   ? ?PLAN:   ? ?In order of problems listed above: ? ?Atypical chest pain with lady with multiple risk factors for coronary artery disease.  We talked in length about what to do with the situation.  I think the best option will be to pursue coronary CT angio.  I explained to her rational for this test.  Plan to do the test sclerosis start taking 1 baby aspirin every single day. ?Essential hypertension, blood pressure seems to be well controlled today continue present management. ?Dyslipidemia she did not have any recent fasting lipid profile done.  I do have some data from 2021 when total cholesterol was 243 and HDL 83 but no LDL given.  We will schedule her to have direct LDL today. ?In the future we will talk about healthy lifestyle need to exercise on the regular basis and good eating habits. ? ? ?Medication Adjustments/Labs and Tests Ordered: ?Current medicines are reviewed at length with the patient today.  Concerns regarding medicines are outlined above.  ?No orders of the defined types were placed in this encounter. ? ?No orders of the defined types were placed in this encounter. ? ? ?Signed, ?Park Liter, MD, [facility]. ?10/17/2021 4:16 PM    ?[place] ? ?

## 2021-10-18 ENCOUNTER — Telehealth: Payer: Self-pay

## 2021-10-18 LAB — LDL CHOLESTEROL, DIRECT: LDL Direct: 105 mg/dL — ABNORMAL HIGH (ref 0–99)

## 2021-10-18 NOTE — Telephone Encounter (Signed)
Patient notified of results.

## 2021-10-18 NOTE — Telephone Encounter (Signed)
-----   Message from Park Liter, MD sent at 10/18/2021 11:02 AM EST ----- ?Cholesterol mildly elevated will wait for results of coronary CT angio before deciding what to do next ?

## 2021-10-25 LAB — BASIC METABOLIC PANEL
BUN/Creatinine Ratio: 19 (ref 12–28)
BUN: 16 mg/dL (ref 8–27)
CO2: 27 mmol/L (ref 20–29)
Calcium: 8.9 mg/dL (ref 8.7–10.3)
Chloride: 95 mmol/L — ABNORMAL LOW (ref 96–106)
Creatinine, Ser: 0.84 mg/dL (ref 0.57–1.00)
Glucose: 99 mg/dL (ref 70–99)
Potassium: 4.7 mmol/L (ref 3.5–5.2)
Sodium: 135 mmol/L (ref 134–144)
eGFR: 78 mL/min/{1.73_m2} (ref >59)

## 2021-10-30 ENCOUNTER — Telehealth (HOSPITAL_COMMUNITY): Payer: Self-pay | Admitting: *Deleted

## 2021-10-30 NOTE — Telephone Encounter (Signed)
Reaching out to patient to offer assistance regarding upcoming cardiac imaging study; pt verbalizes understanding of appt date/time, parking situation and where to check in, pre-test NPO status and medications ordered, and verified current allergies; name and call back number provided for further questions should they arise ? ?Gordy Clement RN Navigator Cardiac Imaging ?[facility] ?[phone] office ?[phone] cell ? ?Patient to take '50mg'$  metoprolol tartrate two hours prior to her cardiac CT scan.  She is aware to arrive at 11am for her 11:30am scan. ?

## 2021-10-31 ENCOUNTER — Other Ambulatory Visit: Payer: Self-pay

## 2021-10-31 ENCOUNTER — Ambulatory Visit (HOSPITAL_COMMUNITY)
Admission: RE | Admit: 2021-10-31 | Discharge: 2021-10-31 | Disposition: A | Discharge status: Home / Self Care | Payer: BC Managed Care – PPO | Source: Ambulatory Visit | Attending: Cardiology | Admitting: Cardiology

## 2021-10-31 DIAGNOSIS — R079 Chest pain, unspecified: Secondary | ICD-10-CM | POA: Diagnosis not present

## 2021-10-31 DIAGNOSIS — I2510 Atherosclerotic heart disease of native coronary artery without angina pectoris: Secondary | ICD-10-CM | POA: Diagnosis not present

## 2021-10-31 MED ORDER — IOHEXOL 350 MG/ML SOLN: 95.0000 mL | Freq: Once | INTRAVENOUS | Status: DC | PRN

## 2021-10-31 MED ORDER — NITROGLYCERIN 0.4 MG SL SUBL
0.8000 mg | SUBLINGUAL_TABLET | Freq: Once | SUBLINGUAL | Status: AC
Start: 2021-10-31 — End: 2021-10-31
  Administered 2021-10-31: 0.8 mg via SUBLINGUAL

## 2021-10-31 MED ORDER — IOHEXOL 350 MG/ML SOLN
95.0000 mL | Freq: Once | INTRAVENOUS | Status: AC | PRN
  Administered 2021-10-31: 95 mL via INTRAVENOUS

## 2021-10-31 MED ORDER — NITROGLYCERIN 0.4 MG SL SUBL
SUBLINGUAL_TABLET | SUBLINGUAL | Status: AC
  Filled 2021-10-31: qty 2

## 2021-12-28 ENCOUNTER — Ambulatory Visit: Payer: BC Managed Care – PPO | Admitting: Cardiology

## 2021-12-28 ENCOUNTER — Encounter: Payer: Self-pay | Admitting: Cardiology

## 2021-12-28 VITALS — BP 120/80 | HR 69 | Ht 67.0 in | Wt 208.8 lb

## 2021-12-28 DIAGNOSIS — I2510 Atherosclerotic heart disease of native coronary artery without angina pectoris: Secondary | ICD-10-CM | POA: Diagnosis not present

## 2021-12-28 DIAGNOSIS — R0789 Other chest pain: Secondary | ICD-10-CM | POA: Diagnosis not present

## 2021-12-28 DIAGNOSIS — E785 Hyperlipidemia, unspecified: Secondary | ICD-10-CM

## 2021-12-28 DIAGNOSIS — I10 Essential (primary) hypertension: Secondary | ICD-10-CM

## 2021-12-28 NOTE — Progress Notes (Signed)
?Cardiology Office Note:   ? ?Date:  12/28/2021  ? ?ID:  [name], DOB 04-09-1956, MRN [id number] ? ?PCP:  Street, Yvonne Mt, MD  ?Cardiologist:  Jenne Campus, MD   ? ?Referring MD: Carlus Pavlov, MD  ? ?Chief Complaint  ?Patient presents with  ? Follow-up  ? ? ?History of Present Illness:   ? ?[name] is a 66 y.o. female with past medical history significant for dyslipidemia, essential hypertension, she was sent to [place] because of atypical chest pain.  She end up having coronary CT angio which showed some coronary disease but none of this seems to be obstructive.  None of this seems to be hemodynamically significant.  Last time when I saw her she was still taking care of her husband sadly 3 weeks ago he passed he did have diffuse aggressive form of cancer and eventually ended up dying because of complication of this.  Obviously she is driving after that.  She is doing well otherwise cardiac wise denies of any chest pain tightness squeezing pressure burning chest. ? ?Past Medical History:  ?Diagnosis Date  ? Breast cancer, right breast ([place]) 12/31/2012  ? Chronic allergic rhinitis due to pollen   ? Essential hypertension 10/08/2016  ? Hyperlipidemia   ? Obesity   ? 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/2016  ? ? ?Past Surgical History:  ?Procedure Laterality Date  ? BREAST LUMPECTOMY  2010  ? right  ? CHOLECYSTECTOMY  2005  ? TUBAL LIGATION    ? ? ?Current Medications: ?Current Meds  ?Medication Sig  ? alendronate (FOSAMAX) 70 MG tablet Take 70 mg by mouth once a week. Take with a full glass of water on an empty stomach.  ? aspirin EC 81 MG tablet Take 1 tablet (81 mg total) by mouth daily. Swallow whole.  ? valsartan-hydrochlorothiazide (DIOVAN-HCT) 160-12.5 MG per tablet Take 1 tablet by mouth daily.  ? [DISCONTINUED] metoprolol tartrate (LOPRESSOR) 50 MG tablet Take 1 tablet (50 mg total) by mouth once for 1 dose. Please take 2 hours before CT  ?  ? ?Allergies:   Patient has no known allergies.   ? ?Social History  ? ?Socioeconomic History  ? Marital status: Widowed  ?  Spouse name: Not on file  ? Number of children: Not on file  ? Years of education: Not on file  ? Highest education level: Not on file  ?Occupational History  ? Not on file  ?Tobacco Use  ? Smoking status: Former  ? Smokeless tobacco: Never  ? Tobacco comments:  ?  Quit over 25 years ago  ?Substance and Sexual Activity  ? Alcohol use: Never  ?  Comment: Occasional  ? Drug use: No  ? Sexual activity: Yes  ?  Birth control/protection: Post-menopausal, Surgical  ?Other Topics Concern  ? Not on file  ?Social History Narrative  ? Not on file  ? ?Social Determinants of Health  ? ?Financial Resource Strain: Not on file  ?Food Insecurity: Not on file  ?Transportation Needs: Not on file  ?Physical Activity: Not on file  ?Stress: Not on file  ?Social Connections: Not on file  ?  ? ?Family History: ?The patient's family history includes Cancer in her father; Diabetes in her father and mother; Heart disease in her father; Hyperlipidemia in her mother; Hypertension in her mother. ?ROS:   ?Please see the history of present illness.    ?All 14 point review of systems negative except as described per history of present illness ? ?  EKGs/Labs/Other Studies Reviewed:   ? ? ? ?Recent Labs: ?10/24/2021: BUN 16; Creatinine, Ser 0.84; Potassium 4.7; Sodium 135  ?Recent Lipid Panel ?   ?Component Value Date/Time  ? LDLDIRECT 105 (H) 10/17/2021 1641  ? ? ?Physical Exam:   ? ?VS:  BP 120/80 (BP Location: Left Arm, Patient Position: Sitting)   Pulse 69   Ht '5\' 7"'$  (1.702 m)   Wt 208 lb 12.8 oz (94.7 kg)   SpO2 95%   BMI 32.70 kg/m?    ? ?Wt Readings from Last 3 Encounters:  ?12/28/21 208 lb 12.8 oz (94.7 kg)  ?10/17/21 206 lb 9.6 oz (93.7 kg)  ?08/27/21 206 lb (93.4 kg)  ?  ? ?GEN:  Well nourished, well developed in no acute distress ?HEENT: Normal ?NECK: No JVD; No carotid bruits ?LYMPHATICS: No lymphadenopathy ?CARDIAC: RRR, no murmurs, no rubs, no  gallops ?RESPIRATORY:  Clear to auscultation without rales, wheezing or rhonchi  ?ABDOMEN: Soft, non-tender, non-distended ?MUSCULOSKELETAL:  No edema; No deformity  ?SKIN: Warm and dry ?LOWER EXTREMITIES: no swelling ?NEUROLOGIC:  Alert and oriented x 3 ?PSYCHIATRIC:  Normal affect  ? ?ASSESSMENT:   ? ?1. Essential hypertension   ?2. Coronary artery disease involving native coronary artery of native heart without angina pectoris   ?3. Dyslipidemia   ?4. Atypical chest pain   ? ?PLAN:   ? ?In order of problems listed above: ? ?Coronary disease with hemodynamically insignificant lesions, she did have mild stenosis of proximal portion of LAD with 25 to 49%, she also got mild stenosis of the proximal portion of the left circumflex as well as 25 to 49% stenosis of the obtuse marginal branch, her calcium score was 84.6.  She is asymptomatic right now she is on aspirin.  The key will be risk factors modifications, her blood pressure is well controlled.  We discussed issue of Mediterranean diet which hopefully should be able to implement. ?Dyslipidemia I do have her fasting lipid profile from 2021 with total cholesterol 243 HDL of 83.  I will ask her to have fasting lipid profile done to judge the need for medications which I think will be there. ?Essential hypertension blood pressure seems to well controlled. ? ? ?Medication Adjustments/Labs and Tests Ordered: ?Current medicines are reviewed at length with the patient today.  Concerns regarding medicines are outlined above.  ?No orders of the defined types were placed in this encounter. ? ?Medication changes: No orders of the defined types were placed in this encounter. ? ? ?Signed, ?Park Liter, MD, [facility] ?12/28/2021 11:38 AM    ?[place] ?

## 2021-12-28 NOTE — Addendum Note (Signed)
Addended by: Darrel Reach on: 12/28/2021 12:01 PM ? ? Modules accepted: Orders ? ?

## 2021-12-28 NOTE — Patient Instructions (Signed)
Medication Instructions:  ?Your physician recommends that you continue on your current medications as directed. Please refer to the Current Medication list given to you today. ? ?*If you need a refill on your cardiac medications before your next appointment, please call your pharmacy* ? ? ?Lab Work: ?Your physician recommends that you return for lab work in: Please come by the office another days next fasting to collect blood work.  ?If you have labs (blood work) drawn today and your tests are completely normal, you will receive your results only by: ?MyChart Message (if you have MyChart) OR ?A paper copy in the mail ?If you have any lab test that is abnormal or we need to change your treatment, we will call you to review the results. ? ? ?Testing/Procedures: ?None ? ? ?Follow-Up: ?At [facility], you and your health needs are our priority.  As part of our continuing mission to provide you with exceptional heart care, we have created designated Provider Care Teams.  These Care Teams include your primary Cardiologist (physician) and Advanced Practice Providers (APPs -  Physician Assistants and Nurse Practitioners) who all work together to provide you with the care you need, when you need it. ? ?We recommend signing up for the patient portal called "MyChart".  Sign up information is provided on this After Visit Summary.  MyChart is used to connect with patients for Virtual Visits (Telemedicine).  Patients are able to view lab/test results, encounter notes, upcoming appointments, etc.  Non-urgent messages can be sent to your provider as well.   ?To learn more about what you can do with MyChart, go to [email or web address].   ? ?Your next appointment:   ?6 month(s) ? ?The format for your next appointment:   ?In Person ? ?Provider:   ?Jenne Campus, MD  ? ? ?Other Instructions ? ? ?Important Information About Sugar ? ? ? ? ?  ?

## 2022-08-09 IMAGING — CT CT HEART MORP W/ CTA COR W/ SCORE W/ CA W/CM &/OR W/O CM
4 of 7 series · 8 of 20 positions shown, 9 images · non-contrast
Comparison: None.
COMPARISON: None.

Addendum:
EXAM:
OVER-READ INTERPRETATION  CT CHEST

The following report is an over-read performed by radiologist Dr.
Lusine Jim [REDACTED] on 10/31/2021. This
over-read does not include interpretation of cardiac or coronary
anatomy or pathology. The coronary calcium score/coronary CTA
interpretation by the cardiologist is attached.
CLINICAL DATA: Chest pain
Cardiac/Coronary  CTA
TECHNIQUE: The patient was scanned on a Phillips Force scanner.

[Series 6: best diast · axial · 0.39mm/px · z∈[+34,+67]mm · 2 of 251 slices shown, 3 images]
[im 84/251  vessel]
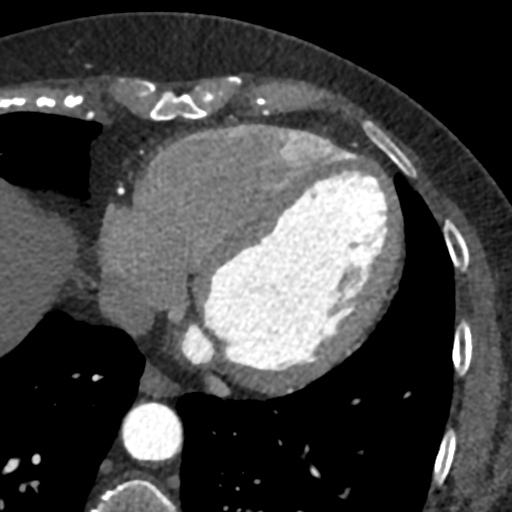
[im 84/251  lung]
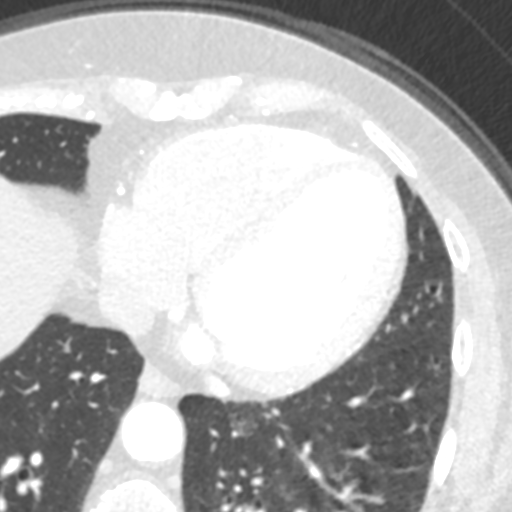
[im 167/251  vessel]
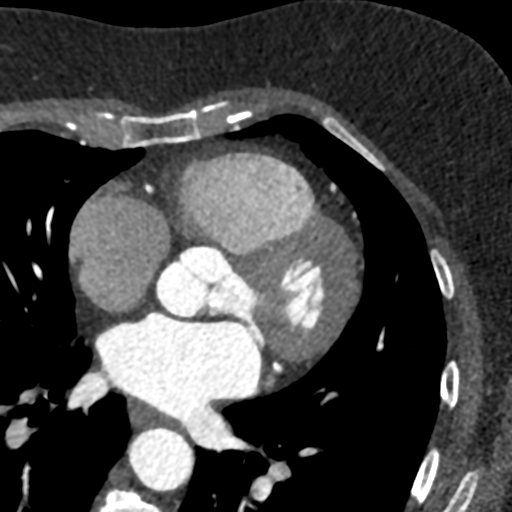

[Series 7: best syst · axial · 0.39mm/px · z∈[+34,+67]mm · 2 of 251 slices shown]
[im 84/251  vessel]
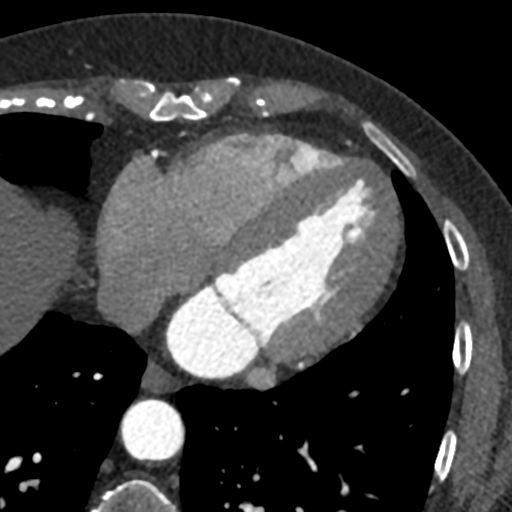
[im 167/251  vessel]
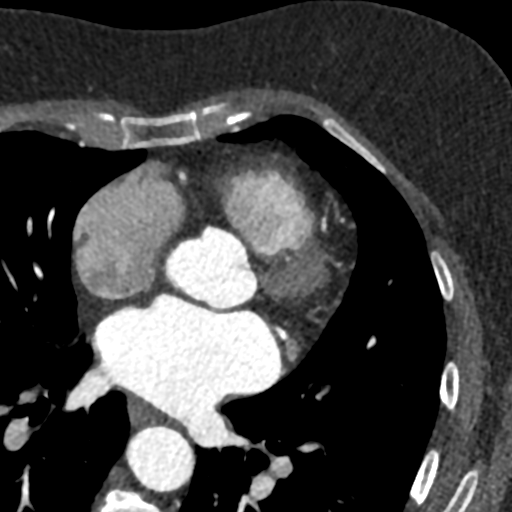

[Series 8: ts diast sharp · axial · 0.39mm/px · z∈[+34,+67]mm · 2 of 251 slices shown]
[im 84/251  lung]
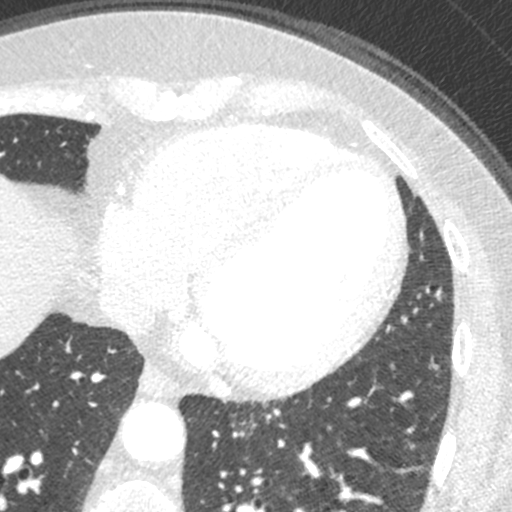
[im 167/251  lung]
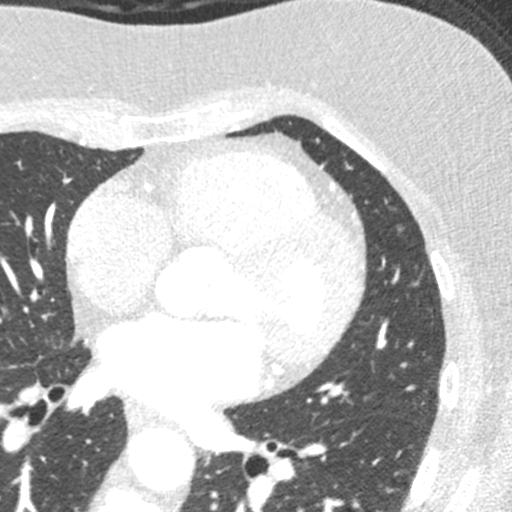

[Series 9: ts syst sharp · axial · 0.39mm/px · z∈[+34,+67]mm · 2 of 251 slices shown]
[im 84/251  lung]
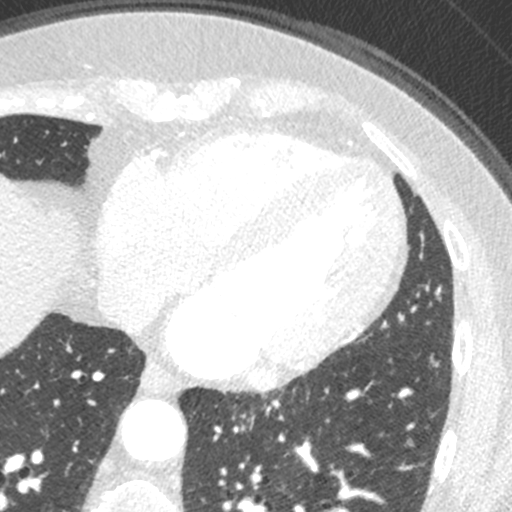
[im 167/251  lung]
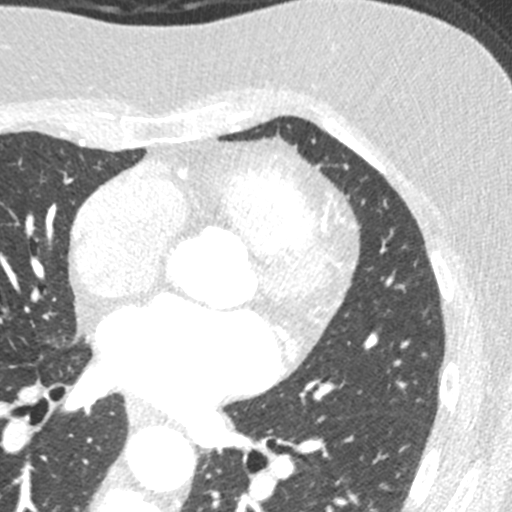

[8 of 20 positions shown; findings below may reference images not displayed]

FINDINGS: Vascular: None.

Mediastinum/Nodes: None.

Lungs/Pleura: Minimal dependent atelectasis.  No pleural fluid.

Upper Abdomen: None.

Musculoskeletal: Degenerative changes in the spine.
IMPRESSION: No acute extracardiac findings.
FINDINGS: A 120 kV prospective scan was triggered in the descending thoracic
aorta at 111 HU's. Axial non-contrast 3 mm slices were carried out
through the heart. The data set was analyzed on a dedicated work
station and scored using the Agatson method. Gantry rotation speed
was 250 msecs and collimation was .6 mm. No beta blockade and 0.8 mg
of sl NTG was given. The 3D data set was reconstructed in 5%
intervals of the 67-82 % of the R-R cycle. Diastolic phases were
analyzed on a dedicated work station using MPR, MIP and VRT modes.
The patient received 80 cc of contrast.

Aorta:  Normal size.  No calcifications.  No dissection.

Aortic Valve:  Trileaflet.  No calcifications.

Coronary Arteries:  Normal coronary origin.  Right dominance.

RCA is a large dominant artery that gives rise to PDA and PLA. There
are small mixed plaques in the proximal and mid portion of this
artery with mild stenosis of 25-49%.

Left main is a large artery that gives rise to LAD and LCX arteries.

LAD is a large vessel that has calcified plaque in its proximal
portion with mild stenosis of 25-49%. This artery gives rise to
small D1 and moderate size D2.

LCX is a non-dominant artery that gives rise to one small OM1 and
large OM2 branches. There is calcified plaque in the proximal
portion of this artery with 49%. Large OM2 branch has soft plaque in
its proximal portion with mild stenosis of 25-49% (closer to 49%).

Other findings:

Normal pulmonary vein drainage into the left atrium.

Normal left atrial appendage without a thrombus.

Normal size of the pulmonary artery.
IMPRESSION: 1. Coronary calcium score of 84.6 LAD 60.4, ALSOKNI 17.1, RCA 7.17. This
was 81 percentile for age and sex matched control.

2. Normal coronary origin with right dominance.

3. CAD-RADS 2. Mild non-obstructive CAD (25-49%) LAD, OM2, RCA.
Consider non-atherosclerotic causes of chest pain. Consider
preventive therapy and risk factor modification.

*** End of Addendum ***
EXAM:
OVER-READ INTERPRETATION  CT CHEST

The following report is an over-read performed by radiologist Dr.
Lusine Jim [REDACTED] on 10/31/2021. This
over-read does not include interpretation of cardiac or coronary
anatomy or pathology. The coronary calcium score/coronary CTA
interpretation by the cardiologist is attached.
FINDINGS: Vascular: None.

Mediastinum/Nodes: None.

Lungs/Pleura: Minimal dependent atelectasis.  No pleural fluid.

Upper Abdomen: None.

Musculoskeletal: Degenerative changes in the spine.
IMPRESSION: No acute extracardiac findings.

## 2022-10-07 ENCOUNTER — Telehealth: Payer: Self-pay

## 2022-10-07 ENCOUNTER — Telehealth: Payer: Self-pay | Admitting: Cardiology

## 2022-10-07 NOTE — Telephone Encounter (Signed)
Pt c/o BP issue: STAT if pt c/o blurred vision, one-sided weakness or slurred speech  1. What are your last 5 BP readings?  02/19 7:00 am 135/75 a few minutes later 124/74 has not had meds 02/18 AM 119/72 6:00 PM 138/79     2. Are you having any other symptoms (ex. Dizziness, headache, blurred vision, passed out)? Pinching feeling above breast, headache, and fatigued  3. What is your BP issue? Concerned with BP readings and pinching feeling in chest.

## 2022-10-07 NOTE — Telephone Encounter (Signed)
Message sent to [facility] regarding this call

## 2022-10-07 NOTE — Telephone Encounter (Signed)
Pt called stating that she is having a "pinching" feeling in her left chest as she has had before. Her BP readings have been ranging 115/64-160/74 at different times. Her HR has been good and Pox in the 90's. Denied chest pressure or shortness of breath. She has been exercising and eating well. Has lost 30lbs. She states that she has no energy. She became tearful when talking about the loss of her husband.

## 2022-10-09 NOTE — Telephone Encounter (Signed)
Spoke with pt regarding Dr. Wendy Poet note. Pt stated that she has an appt with her PCP tomorrow and will discuss her symptoms at that time.

## 2022-10-10 DIAGNOSIS — E782 Mixed hyperlipidemia: Secondary | ICD-10-CM | POA: Diagnosis not present

## 2022-10-10 DIAGNOSIS — E663 Overweight: Secondary | ICD-10-CM | POA: Diagnosis not present

## 2022-10-10 DIAGNOSIS — Z6828 Body mass index (BMI) 28.0-28.9, adult: Secondary | ICD-10-CM | POA: Diagnosis not present

## 2022-10-10 DIAGNOSIS — Z0000 Encounter for general adult medical examination without abnormal findings: Secondary | ICD-10-CM | POA: Diagnosis not present

## 2022-10-10 DIAGNOSIS — I10 Essential (primary) hypertension: Secondary | ICD-10-CM | POA: Diagnosis not present

## 2022-10-10 DIAGNOSIS — Z2821 Immunization not carried out because of patient refusal: Secondary | ICD-10-CM | POA: Diagnosis not present

## 2022-10-10 DIAGNOSIS — Z131 Encounter for screening for diabetes mellitus: Secondary | ICD-10-CM | POA: Diagnosis not present

## 2022-10-23 DIAGNOSIS — L821 Other seborrheic keratosis: Secondary | ICD-10-CM | POA: Diagnosis not present

## 2022-10-23 DIAGNOSIS — L814 Other melanin hyperpigmentation: Secondary | ICD-10-CM | POA: Diagnosis not present

## 2022-10-23 DIAGNOSIS — D225 Melanocytic nevi of trunk: Secondary | ICD-10-CM | POA: Diagnosis not present

## 2022-10-23 DIAGNOSIS — D2239 Melanocytic nevi of other parts of face: Secondary | ICD-10-CM | POA: Diagnosis not present

## 2022-10-30 DIAGNOSIS — Z1231 Encounter for screening mammogram for malignant neoplasm of breast: Secondary | ICD-10-CM | POA: Diagnosis not present

## 2022-10-30 DIAGNOSIS — Z780 Asymptomatic menopausal state: Secondary | ICD-10-CM | POA: Diagnosis not present

## 2022-10-31 ENCOUNTER — Encounter: Payer: Self-pay | Admitting: Family Medicine

## 2022-10-31 DIAGNOSIS — R928 Other abnormal and inconclusive findings on diagnostic imaging of breast: Secondary | ICD-10-CM

## 2022-11-04 DIAGNOSIS — R928 Other abnormal and inconclusive findings on diagnostic imaging of breast: Secondary | ICD-10-CM | POA: Diagnosis not present

## 2022-11-04 DIAGNOSIS — N6323 Unspecified lump in the left breast, lower outer quadrant: Secondary | ICD-10-CM | POA: Diagnosis not present

## 2022-11-06 ENCOUNTER — Other Ambulatory Visit: Payer: Self-pay

## 2022-11-06 DIAGNOSIS — C50812 Malignant neoplasm of overlapping sites of left female breast: Secondary | ICD-10-CM | POA: Diagnosis not present

## 2022-11-06 DIAGNOSIS — C50512 Malignant neoplasm of lower-outer quadrant of left female breast: Secondary | ICD-10-CM | POA: Diagnosis not present

## 2022-11-08 ENCOUNTER — Telehealth: Payer: Self-pay | Admitting: Hematology and Oncology

## 2022-11-08 ENCOUNTER — Telehealth: Payer: Self-pay | Admitting: *Deleted

## 2022-11-08 NOTE — Telephone Encounter (Signed)
Spoke to patient to confirm upcoming afternoon [facility] clinic appointment on 4/3, paperwork will be sent via [employer].   Gave location and time, also informed patient that the surgeon's office would be calling as well to get information from them similar to the packet that they will be receiving so make sure to do both.  Reminded patient that all providers will be coming to the clinic to see them HERE and if they had any questions to not hesitate to reach back out to myself or their navigators.

## 2022-11-08 NOTE — Telephone Encounter (Signed)
Left message for patient to return call in reference to upcoming clinic appointment on 4/3

## 2022-11-15 ENCOUNTER — Ambulatory Visit: Payer: BC Managed Care – PPO | Admitting: Cardiology

## 2022-11-18 ENCOUNTER — Encounter: Payer: Self-pay | Admitting: Family Medicine

## 2022-11-18 ENCOUNTER — Encounter: Payer: Self-pay | Admitting: *Deleted

## 2022-11-18 DIAGNOSIS — Z170 Estrogen receptor positive status [ER+]: Secondary | ICD-10-CM

## 2022-11-19 NOTE — Progress Notes (Signed)
Radiation Oncology         [phone] ________________________________  Multidisciplinary Breast Oncology Clinic [facility]) Initial Outpatient Consultation  Name: [name] MRN: [id number]  Date: 11/20/2022  DOB: 05-04-1956  [id number], Sharon Mt, MD  Rolm Bookbinder, MD   REFERRING PHYSICIAN: Rolm Bookbinder, MD  DIAGNOSIS: There were no encounter diagnoses.  Stage *** Left Breast UOQ, Invasive and in situ lobular carcinoma, ER+ / PR+ / Her2-, Grade 2  No diagnosis found.  HISTORY OF PRESENT ILLNESS::[name] is a 67 y.o. female who is presenting to the office today for evaluation of her newly diagnosed breast cancer. She is accompanied by ***. She is doing well overall.   She had routine screening mammography on 10/30/22 showing a possible abnormality in the left breast. She underwent left breast diagnostic mammography with tomography at [facility] on 11/04/22 showing an indeterminate 0.5 cm focal asymmetry in the central left breast, 4 cmfn, at a middle depth. Ultrasound of the left breast further revealed ***.  Biopsy of the 3 o'clock left breast mass (located 2-3 cmfn) on 11/06/22 showed: grade 2 invasive lobular carcinoma measuring 4 mm in the greatest linear extent of the sample with LCIS . Prognostic indicators significant for: estrogen receptor, 100% positive and progesterone receptor, 70% positive, both with strong staining intensity. Proliferation marker Ki67 at 10%. HER2 negative.  The patient has cancer history significant for invasive ductal carcinoma of the right breast diagnosed in 2010 (ER 98%, PR 80%, Ki-67 15%, HER-2/neu by FISH). S/p right lumpectomy, XRT completed in November 2010 in [place], and antiestrogen therapy consisting of letrozole completed in March 2016  Menarche: *** years old Age at first live birth: *** years old GP: *** LMP: *** Contraceptive: *** HRT: ***   The patient was referred today for presentation in the multidisciplinary  conference.  Radiology studies and pathology slides were presented there for review and discussion of treatment options.  A consensus was discussed regarding potential next steps.  PREVIOUS RADIATION THERAPY: Yes, for right breast IDC, completed in 2010 at the [place]:  Past Medical History:  Diagnosis Date   Breast cancer, right breast ([place]) 12/31/2012   Chronic allergic rhinitis due to pollen    Essential hypertension 10/08/2016   Hyperlipidemia    Obesity    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/2016    PAST SURGICAL HISTORY: Past Surgical History:  Procedure Laterality Date   BREAST LUMPECTOMY  2010   right   CHOLECYSTECTOMY  2005   TUBAL LIGATION      FAMILY HISTORY:  Family History  Problem Relation Age of Onset   Hyperlipidemia Mother    Diabetes Mother    Hypertension Mother    Cancer Father        kidney   Heart disease Father    Diabetes Father     SOCIAL HISTORY:  Social History   Socioeconomic History   Marital status: Widowed    Spouse name: Not on file   Number of children: Not on file   Years of education: Not on file   Highest education level: Not on file  Occupational History   Not on file  Tobacco Use   Smoking status: Former   Smokeless tobacco: Never   Tobacco comments:    Quit over 25 years ago  Substance and Sexual Activity   Alcohol use: Never    Comment: Occasional   Drug use: No   Sexual activity: Yes  Birth control/protection: Post-menopausal, Surgical  Other Topics Concern   Not on file  Social History Narrative   Not on file   Social Determinants of Health   Financial Resource Strain: Not on file  Food Insecurity: Not on file  Transportation Needs: Not on file  Physical Activity: Not on file  Stress: Not on file  Social Connections: Not on file    ALLERGIES: No Known Allergies  MEDICATIONS:  Current Outpatient Medications  Medication Sig Dispense Refill   alendronate  (FOSAMAX) 70 MG tablet Take 70 mg by mouth once a week. Take with a full glass of water on an empty stomach.     aspirin EC 81 MG tablet Take 1 tablet (81 mg total) by mouth daily. Swallow whole. 90 tablet 3   valsartan-hydrochlorothiazide (DIOVAN-HCT) 160-12.5 MG per tablet Take 1 tablet by mouth daily.     No current facility-administered medications for this encounter.    REVIEW OF SYSTEMS: A 10+ POINT REVIEW OF SYSTEMS WAS OBTAINED including neurology, dermatology, psychiatry, cardiac, respiratory, lymph, extremities, GI, GU, musculoskeletal, constitutional, reproductive, HEENT. On the provided form, she reports ***. She denies *** and any other symptoms.    PHYSICAL EXAM:  vitals were not taken for this visit.  {may need to copy over vitals} Lungs are clear to auscultation bilaterally. Heart has regular rate and rhythm. No palpable cervical, supraclavicular, or axillary adenopathy. Abdomen soft, non-tender, normal bowel sounds. Breast: *** breast with no palpable mass, nipple discharge, or bleeding. *** breast with ***.   KPS = ***  100 - Normal; no complaints; no evidence of disease. 90   - Able to carry on normal activity; minor signs or symptoms of disease. 80   - Normal activity with effort; some signs or symptoms of disease. 28   - Cares for self; unable to carry on normal activity or to do active work. 60   - Requires occasional assistance, but is able to care for most of his personal needs. 50   - Requires considerable assistance and frequent medical care. 6   - Disabled; requires special care and assistance. 58   - Severely disabled; hospital admission is indicated although death not imminent. 49   - Very sick; hospital admission necessary; active supportive treatment necessary. 10   - Moribund; fatal processes progressing rapidly. 0     - Dead  Karnofsky DA, Abelmann [place], Craver LS and Burchenal [facility] [phone]) The use of the nitrogen mustards in the palliative treatment of  carcinoma: with particular reference to bronchogenic carcinoma Cancer 1 [id number]  LABORATORY DATA:  Lab Results  Component Value Date   WBC 4.0 12/29/2014   HGB 11.5 (L) 12/29/2014   HCT 35.5 12/29/2014   MCV 88.3 12/29/2014   PLT 203 12/29/2014   Lab Results  Component Value Date   NA 135 10/24/2021   K 4.7 10/24/2021   CL 95 (L) 10/24/2021   CO2 27 10/24/2021   Lab Results  Component Value Date   ALT 20 12/29/2014   AST 24 12/29/2014   ALKPHOS 110 12/29/2014   BILITOT 0.46 12/29/2014    PULMONARY FUNCTION TEST:   Review Flowsheet        No data to display          RADIOGRAPHY: No results found.    IMPRESSION: *** {DIAGNOSIS HERE}  Patient will be a good candidate for breast conservation with radiotherapy to the left breast. We discussed the general course of radiation, potential side effects, and toxicities  with radiation and the patient is interested in this approach. ***   PLAN:  ***    ------------------------------------------------  Blair Promise, PhD, MD  This document serves as a record of services personally performed by Gery Pray, MD. It was created on his behalf by Roney Mans, a trained medical scribe. The creation of this record is based on the scribe's personal observations and the provider's statements to them. This document has been checked and approved by the attending provider.

## 2022-11-20 ENCOUNTER — Inpatient Hospital Stay: Payer: Medicare HMO | Attending: Hematology and Oncology | Admitting: Hematology and Oncology

## 2022-11-20 ENCOUNTER — Other Ambulatory Visit: Payer: Self-pay | Admitting: General Surgery

## 2022-11-20 ENCOUNTER — Other Ambulatory Visit: Payer: Self-pay

## 2022-11-20 ENCOUNTER — Inpatient Hospital Stay: Payer: Medicare HMO

## 2022-11-20 ENCOUNTER — Encounter: Payer: Self-pay | Admitting: General Practice

## 2022-11-20 ENCOUNTER — Encounter: Payer: Self-pay | Admitting: Hematology and Oncology

## 2022-11-20 ENCOUNTER — Inpatient Hospital Stay (HOSPITAL_BASED_OUTPATIENT_CLINIC_OR_DEPARTMENT_OTHER): Payer: Medicare HMO | Admitting: Genetic Counselor

## 2022-11-20 ENCOUNTER — Ambulatory Visit
Admission: RE | Admit: 2022-11-20 | Discharge: 2022-11-20 | Disposition: A | Discharge status: Home / Self Care | Payer: BC Managed Care – PPO | Source: Ambulatory Visit | Attending: Radiation Oncology | Admitting: Radiation Oncology

## 2022-11-20 ENCOUNTER — Encounter: Payer: Self-pay | Admitting: *Deleted

## 2022-11-20 VITALS — BP 151/76 | HR 66 | Temp 97.7°F | Resp 18 | Ht 67.0 in | Wt 180.1 lb

## 2022-11-20 DIAGNOSIS — Z79811 Long term (current) use of aromatase inhibitors: Secondary | ICD-10-CM | POA: Insufficient documentation

## 2022-11-20 DIAGNOSIS — Z853 Personal history of malignant neoplasm of breast: Secondary | ICD-10-CM | POA: Diagnosis not present

## 2022-11-20 DIAGNOSIS — Z170 Estrogen receptor positive status [ER+]: Secondary | ICD-10-CM

## 2022-11-20 DIAGNOSIS — I10 Essential (primary) hypertension: Secondary | ICD-10-CM | POA: Insufficient documentation

## 2022-11-20 DIAGNOSIS — Z923 Personal history of irradiation: Secondary | ICD-10-CM | POA: Diagnosis not present

## 2022-11-20 DIAGNOSIS — C50412 Malignant neoplasm of upper-outer quadrant of left female breast: Secondary | ICD-10-CM | POA: Diagnosis not present

## 2022-11-20 DIAGNOSIS — M8580 Other specified disorders of bone density and structure, unspecified site: Secondary | ICD-10-CM | POA: Insufficient documentation

## 2022-11-20 DIAGNOSIS — E669 Obesity, unspecified: Secondary | ICD-10-CM | POA: Insufficient documentation

## 2022-11-20 DIAGNOSIS — Z8042 Family history of malignant neoplasm of prostate: Secondary | ICD-10-CM

## 2022-11-20 DIAGNOSIS — Z808 Family history of malignant neoplasm of other organs or systems: Secondary | ICD-10-CM | POA: Diagnosis not present

## 2022-11-20 DIAGNOSIS — E785 Hyperlipidemia, unspecified: Secondary | ICD-10-CM | POA: Diagnosis not present

## 2022-11-20 DIAGNOSIS — Z7982 Long term (current) use of aspirin: Secondary | ICD-10-CM | POA: Diagnosis not present

## 2022-11-20 DIAGNOSIS — C50512 Malignant neoplasm of lower-outer quadrant of left female breast: Secondary | ICD-10-CM | POA: Diagnosis not present

## 2022-11-20 LAB — CBC WITH DIFFERENTIAL (CANCER CENTER ONLY)
Abs Immature Granulocytes: 0.02 10*3/uL (ref 0.00–0.07)
Basophils Absolute: 0.1 10*3/uL (ref 0.0–0.1)
Basophils Relative: 1 %
Eosinophils Absolute: 0.1 10*3/uL (ref 0.0–0.5)
Eosinophils Relative: 2 %
HCT: 39.5 % (ref 36.0–46.0)
Hemoglobin: 13 g/dL (ref 12.0–15.0)
Immature Granulocytes: 0 %
Lymphocytes Relative: 26 %
Lymphs Abs: 1.7 10*3/uL (ref 0.7–4.0)
MCH: 29.5 pg (ref 26.0–34.0)
MCHC: 32.9 g/dL (ref 30.0–36.0)
MCV: 89.6 fL (ref 80.0–100.0)
Monocytes Absolute: 0.6 10*3/uL (ref 0.1–1.0)
Monocytes Relative: 9 %
Neutro Abs: 4 10*3/uL (ref 1.7–7.7)
Neutrophils Relative %: 62 %
Platelet Count: 265 10*3/uL (ref 150–400)
RBC: 4.41 MIL/uL (ref 3.87–5.11)
RDW: 12.5 % (ref 11.5–15.5)
WBC Count: 6.5 10*3/uL (ref 4.0–10.5)
nRBC: 0 % (ref 0.0–0.2)

## 2022-11-20 LAB — CMP (CANCER CENTER ONLY)
ALT: 32 U/L (ref 0–44)
AST: 32 U/L (ref 15–41)
Albumin: 4.2 g/dL (ref 3.5–5.0)
Alkaline Phosphatase: 133 U/L — ABNORMAL HIGH (ref 38–126)
Anion gap: 6 (ref 5–15)
BUN: 28 mg/dL — ABNORMAL HIGH (ref 8–23)
CO2: 33 mmol/L — ABNORMAL HIGH (ref 22–32)
Calcium: 9.7 mg/dL (ref 8.9–10.3)
Chloride: 97 mmol/L — ABNORMAL LOW (ref 98–111)
Creatinine: 0.86 mg/dL (ref 0.44–1.00)
GFR, Estimated: >60 mL/min (ref >60)
Glucose, Bld: 122 mg/dL — ABNORMAL HIGH (ref 70–99)
Potassium: 4.4 mmol/L (ref 3.5–5.1)
Sodium: 136 mmol/L (ref 135–145)
Total Bilirubin: 0.5 mg/dL (ref 0.3–1.2)
Total Protein: 7.6 g/dL (ref 6.5–8.1)

## 2022-11-20 LAB — GENETIC SCREENING ORDER

## 2022-11-20 NOTE — Assessment & Plan Note (Addendum)
11/06/2022:Screening mammogram detected left breast mass 5 mm 3 o'clock position, ultrasound-guided biopsy revealed grade 2 ILC with LCIS ER 100% PR 100%, Ki-67 10%, HER2 equivalent, FISH negative  (Prior history of right breast cancer 2010 treated with lumpectomy radiation and 5 years of letrozole)  Pathology and radiology counseling:Discussed with the patient, the details of pathology including the type of breast cancer,the clinical staging, the significance of ER, PR and HER-2/neu receptors and the implications for treatment. After reviewing the pathology in detail, we proceeded to discuss the different treatment options between surgery, radiation, chemotherapy, antiestrogen therapies.  Recommendations: 1. Breast conserving surgery followed by 2. if final tumor is greater than 5 mm, Oncotype DX testing to determine if chemotherapy would be of any benefit followed by 3. Adjuvant radiation therapy followed by 4. Adjuvant antiestrogen therapy with aromatase inhibitors for 10 years 5.  Genetic testing  Oncotype counseling: I discussed Oncotype DX test. I explained to the patient that this is a 21 gene panel to evaluate patient tumors DNA to calculate recurrence score. This would help determine whether patient has high risk or low risk breast cancer. She understands that if her tumor was found to be high risk, she would benefit from systemic chemotherapy. If low risk, no need of chemotherapy.  Return to clinic after surgery to discuss final pathology report and then determine if Oncotype DX testing will need to be sent.

## 2022-11-20 NOTE — Progress Notes (Signed)
[facility] Psychosocial Distress Screening Spiritual Care  Met with [name] and [name], who lives in [place], in [facility] to introduce [facility] team/resources, reviewing distress screen per protocol.  The patient scored a 7 on the Psychosocial Distress Thermometer which indicates severe distress. Also assessed for distress and other psychosocial needs.      11/20/2022    4:03 PM  ONCBCN DISTRESS SCREENING  Screening Type Initial Screening  Distress experienced in past week (1-10) 7  Referral to support programs Yes    Chaplain and patient discussed common feelings and emotions when being diagnosed with cancer, and the importance of support during treatment.  Chaplain informed patient of the support team and support services at [facility].  Chaplain provided contact information and encouraged patient to call with any questions or concerns.  [name] reports good support from her family, including her local daughter, too, and notes that [facility] helped reduce her distress by resolving some of the unknown.  Follow up needed: No. [name] plans to reach out as needed/desired.   [place], [place], [facility] Pager [phone] Voicemail [phone]

## 2022-11-20 NOTE — Progress Notes (Signed)
[facility] NOTE  Patient Care Team: Street, Sharon Mt, MD as PCP - General (Family Medicine) Nicholas Lose, MD as Consulting Physician (Hematology and Oncology) Gery Pray, MD as Consulting Physician (Radiation Oncology) Rolm Bookbinder, MD as Consulting Physician (General Surgery) Mauro Kaufmann, RN as Oncology Nurse Navigator Rockwell Germany, RN as Oncology Nurse Navigator  CHIEF COMPLAINTS/PURPOSE OF CONSULTATION:  Newly diagnosed breast cancer  HISTORY OF PRESENTING ILLNESS:  [name] 67 y.o. female is here because of recent diagnosis of left breast cancer.  Patient had a previous history of right breast cancer treated with lumpectomy radiation and antiestrogen therapy.  This was in 2010.  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  She was presented this morning to the multidisciplinary tumor board and she is here today to discuss treatment plan.  I reviewed her records extensively and collaborated the history with the patient.  SUMMARY OF ONCOLOGIC HISTORY: Oncology History  Malignant neoplasm of upper-outer quadrant of left breast in female, estrogen receptor positive  11/06/2022 Initial Diagnosis   Prior history of right breast cancer 2010 treated with lumpectomy radiation and 5 years of letrozole Screening mammogram detected left breast mass 5 mm 3 o'clock position, ultrasound-guided biopsy revealed grade 2 ILC with LCIS ER 100% PR 100%, Ki-67 10%, HER2 equivalent, FISH negative   11/20/2022 Cancer Staging   Staging form: Breast, AJCC 8th Edition - Clinical stage from 11/20/2022: Stage IA (cT1a, cN0, cM0, G2, ER+, PR+, HER2-) - Signed by Nicholas Lose, MD on 11/20/2022 Stage prefix: Initial diagnosis Histologic grading system: 3 grade system      MEDICAL HISTORY:  Past  Medical History:  Diagnosis Date   Breast cancer    Breast cancer, right breast 12/31/2012   Chronic allergic rhinitis due to pollen    Essential hypertension 10/08/2016   Hyperlipidemia    Obesity    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/2016    SURGICAL HISTORY: Past Surgical History:  Procedure Laterality Date   BREAST LUMPECTOMY  2010   right   CHOLECYSTECTOMY  2005   TUBAL LIGATION      SOCIAL HISTORY: Social History   Socioeconomic History   Marital status: Widowed    Spouse name: Not on file   Number of children: Not on file   Years of education: Not on file   Highest education level: Not on file  Occupational History   Not on file  Tobacco Use   Smoking status: Former   Smokeless tobacco: Never   Tobacco comments:    Quit over 25 years ago  Substance and Sexual Activity   Alcohol use: Never    Comment: Occasional   Drug use: No   Sexual activity: Yes    Birth control/protection: Post-menopausal, Surgical  Other Topics Concern   Not on file  Social History Narrative   Not on file   Social Determinants of Health   Financial Resource Strain: Not on file  Food Insecurity: Not on file  Transportation Needs: Not on file  Physical Activity: Not on file  Stress: Not on file  Social Connections: Not on file  Intimate Partner Violence: Not on file    FAMILY HISTORY: Family History  Problem Relation Age of Onset   Hyperlipidemia Mother    Diabetes Mother    Hypertension Mother  Cancer Father        kidney   Heart disease Father    Diabetes Father     ALLERGIES:  has No Known Allergies.  MEDICATIONS:  Current Outpatient Medications  Medication Sig Dispense Refill   valsartan-hydrochlorothiazide (DIOVAN-HCT) 160-12.5 MG per tablet Take 1 tablet by mouth daily.     alendronate (FOSAMAX) 70 MG tablet Take 70 mg by mouth once a week. Take with a full glass of water on an empty stomach. (Patient not taking: Reported on 11/20/2022)     aspirin EC 81  MG tablet Take 1 tablet (81 mg total) by mouth daily. Swallow whole. (Patient not taking: Reported on 11/20/2022) 90 tablet 3   No current facility-administered medications for this visit.    REVIEW OF SYSTEMS:   Constitutional: Denies fevers, chills or abnormal night sweats   All other systems were reviewed with the patient and are negative.  PHYSICAL EXAMINATION: ECOG PERFORMANCE STATUS: 1 - Symptomatic but completely ambulatory  Vitals:   11/20/22 1258  BP: (!) 151/76  Pulse: 66  Resp: 18  Temp: 97.7 F (36.5 C)  SpO2: 99%   Filed Weights   11/20/22 1258  Weight: 180 lb 1.6 oz (81.7 kg)    GENERAL:alert, no distress and comfortable    LABORATORY DATA:  I have reviewed the data as listed Lab Results  Component Value Date   WBC 6.5 11/20/2022   HGB 13.0 11/20/2022   HCT 39.5 11/20/2022   MCV 89.6 11/20/2022   PLT 265 11/20/2022   Lab Results  Component Value Date   NA 136 11/20/2022   K 4.4 11/20/2022   CL 97 (L) 11/20/2022   CO2 33 (H) 11/20/2022    RADIOGRAPHIC STUDIES: I have personally reviewed the radiological reports and agreed with the findings in the report.  ASSESSMENT AND PLAN:  Malignant neoplasm of upper-outer quadrant of left breast in female, estrogen receptor positive 11/06/2022:Screening mammogram detected left breast mass 5 mm 3 o'clock position, ultrasound-guided biopsy revealed grade 2 ILC with LCIS ER 100% PR 100%, Ki-67 10%, HER2 equivalent, FISH negative  (Prior history of right breast cancer 2010 treated with lumpectomy radiation and 5 years of letrozole)  Pathology and radiology counseling:Discussed with the patient, the details of pathology including the type of breast cancer,the clinical staging, the significance of ER, PR and HER-2/neu receptors and the implications for treatment. After reviewing the pathology in detail, we proceeded to discuss the different treatment options between surgery, radiation, chemotherapy, antiestrogen  therapies.  Recommendations: 1. Breast conserving surgery followed by 2. if final tumor is greater than 5 mm, Oncotype DX testing to determine if chemotherapy would be of any benefit followed by 3. Adjuvant radiation therapy followed by 4. Adjuvant antiestrogen therapy with aromatase inhibitors for 10 years 5.  Genetic testing  Oncotype counseling: I discussed Oncotype DX test. I explained to the patient that this is a 21 gene panel to evaluate patient tumors DNA to calculate recurrence score. This would help determine whether patient has high risk or low risk breast cancer. She understands that if her tumor was found to be high risk, she would benefit from systemic chemotherapy. If low risk, no need of chemotherapy.  Return to clinic after surgery to discuss final pathology report and then determine if Oncotype DX testing will need to be sent.   All questions were answered. The patient knows to call the clinic with any problems, questions or concerns.    Harriette Ohara, MD 11/20/22

## 2022-11-20 NOTE — Research (Signed)
Trial:  [employer] 2021-05 - Specimen Collection Study to Evaluate Biomarkers in Subjects with Cancer   Patient [name] was identified by Dr. Lindi Adie as a potential candidate for the above listed study.  This Clinical Research Nurse met with [name], [id number], on 11/20/22 in a manner and location that ensures patient privacy to discuss participation in the above listed research study.  Patient is Accompanied by [name] .  A copy of the informed consent document with embedded HIPAA language was provided to the patient.  Patient reads, speaks, and understands [place].   Patient was provided with the business card of this Nurse and encouraged to contact the research team with any questions.  Approximately 5 minutes were spent with the patient reviewing the informed consent documents.  Patient was provided the option of taking informed consent documents home to review and was encouraged to review at their convenience with their support network, including other care providers. Patient took the consent documents home to review. [name], BSN, RN, [place] Nurse II [phone] 11/20/2022 2:44 PM

## 2022-11-21 ENCOUNTER — Encounter: Payer: Self-pay | Admitting: Genetic Counselor

## 2022-11-21 NOTE — Progress Notes (Signed)
REFERRING PROVIDER: Nicholas Lose, MD [place],  [place] [address]  PRIMARY PROVIDER:  Street, Sharon Mt, MD  PRIMARY REASON FOR VISIT:  1. Malignant neoplasm of upper-outer quadrant of left breast in female, estrogen receptor positive   2. Personal history of breast cancer   3. Family history of prostate cancer     HISTORY OF PRESENT ILLNESS:   [name], a 67 y.o. female, was seen for a [place] cancer genetics consultation at the request of Dr. Lindi Adie due to a personal history of breast cancer.  Ms. Mcnichol presents to clinic today to discuss the possibility of a hereditary predisposition to cancer, to discuss genetic testing, and to further clarify her future cancer risks, as well as potential cancer risks for family members.   In 2010, at the age of 29, Ms. Barsch was diagnosed with right breast cancer s/p lumpectomy, radiation, and anti-estrogens.  In April 2024, at the age of 47, Ms. Halterman was diagnosed with invasive lobular carcinoma of the left breast (ER+/PR+/HER2-).  The treatment plan is pending.     CANCER HISTORY:  Oncology History  Malignant neoplasm of upper-outer quadrant of left breast in female, estrogen receptor positive  11/06/2022 Initial Diagnosis   Prior history of right breast cancer 2010 treated with lumpectomy radiation and 5 years of letrozole Screening mammogram detected left breast mass 5 mm 3 o'clock position, ultrasound-guided biopsy revealed grade 2 ILC with LCIS ER 100% PR 100%, Ki-67 10%, HER2 equivalent, FISH negative   11/20/2022 Cancer Staging   Staging form: Breast, AJCC 8th Edition - Clinical stage from 11/20/2022: Stage IA (cT1a, cN0, cM0, G2, ER+, PR+, HER2-) - Signed by Nicholas Lose, MD on 11/20/2022 Stage prefix: Initial diagnosis Histologic grading system: 3 grade system       Past Medical History:  Diagnosis Date   Breast cancer    Breast cancer, right breast 12/31/2012   Chronic allergic rhinitis due  to pollen    Essential hypertension 10/08/2016   Hyperlipidemia    Obesity    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  Significant diagnoses are listed below: Family History  Problem Relation Age of Onset   Kidney cancer Father 96   Prostate cancer Maternal Grandfather        dx 77s    Ms. Strickfaden is unaware of previous family history of genetic testing for hereditary cancer risks. There is no reported Ashkenazi Jewish ancestry. There is no known consanguinity.  GENETIC COUNSELING ASSESSMENT: Ms. Samaras is a 67 y.o. female with a personal history which is somewhat suggestive of a hereditary cancer syndrome given her history of two breast cancer primaries. We, therefore, discussed and recommended the following at today's visit.   DISCUSSION: We discussed that 5 - 10% of cancer is hereditary.  Most cases of hereditary breast cancer is associated with mutations in BRCA1/2.  There are other genes that can be associated with hereditary breast cancer syndromes.  We discussed that testing is beneficial for several reasons including knowing how to follow individuals for their cancer risks and understanding if other family members could be at risk for cancer and allowing them to undergo genetic testing.   We reviewed the characteristics, features and inheritance patterns of hereditary cancer  syndromes. We also discussed genetic testing, including the appropriate family members to test, the process of testing, insurance coverage and turn-around-time for results. We discussed the implications of a negative, positive, carrier and/or variant of uncertain significant result. We recommended Ms. Trudgeon pursue genetic testing for a panel that includes genes associated with breast, prostate, kidney,  and other cancers.   The CustomNext-Cancer+RNAinsight panel offered by [facility] includes sequencing and rearrangement analysis for the following 50 genes (common hereditary +kidney cancer genes):  APC, ATM, AXIN2, BARD1, BMPR1A, BRCA1, BRCA2, BRIP1, CDH1, CDK4, CDKN2A, CHEK2, DICER1, EPCAM, FH, FLCN, GREM1, HOXB13, MEN1, MET, MLH1, MSH2, MSH3, MSH6, MUTYH, NBN, NF1, NF2, NTHL1, PALB2, PMS2, POLD1, POLE, PTEN, RAD51C, RAD51D, RECQL, RET, SDHA, SDHAF2, SDHB, SDHC, SDHD, SMAD4, SMARCA4, STK11, TP53, TSC1, TSC2, and VHL.  RNA data is routinely analyzed for use in variant interpretation for all genes.  Based on Ms. Monical's personal and family history of cancer, she meets medical criteria for genetic testing. Despite that she meets criteria, she may still have an out of pocket cost. We discussed that if her out of pocket cost for testing is over $100, the laboratory should contact her and discuss the self-pay prices and/or patient pay assistance programs.    PLAN: After considering the risks, benefits, and limitations, Ms. Ulrey provided informed consent to pursue genetic testing and the blood sample was sent to [facility] for analysis of the CustomNext-Cancer +RNAinsight Panel. Results should be available within approximately 3 weeks' time, at which point they will be disclosed by telephone to Ms. Odgers, as will any additional recommendations warranted by these results. Ms. Greenlees will receive a summary of her genetic counseling visit and a copy of her results once available. This information will also be available in Epic.   Ms. Migues questions were answered to her satisfaction today. Our contact information was provided should additional questions or concerns arise. Thank you for the referral and allowing [place] to share in the care of your patient.   Trenden Hazelrigg M. Joette Catching, [place], [facility] Genetic Counselor [email or web address] (P) [phone]  The patient was seen for a total of 15  minutes in face-to-face genetic counseling.  Patient was accompanied by her daughter, Golden Circle.  Drs. Lindi Adie was available to discuss this case as needed.    _______________________________________________________________________ For Office Staff:  Number of people involved in session: 2 Was an Intern/ student involved with case: no

## 2022-11-25 ENCOUNTER — Telehealth: Payer: Self-pay | Admitting: Radiology

## 2022-11-25 NOTE — Telephone Encounter (Signed)
[employer] 2021-05 - Specimen Collection Study to Evaluate Biomarkers in Subjects with Cancer    11/25/2022  PHONE CALL: LVM for patient to return call if she is interested in the above mentioned study.   Merri Brunette, RT(R)(T) Clinical Research Coordinator

## 2022-11-25 NOTE — Pre-Procedure Instructions (Signed)
Surgical Instructions    Your procedure is scheduled on December 03, 2022.  Report to [facility] Main Entrance "A" at 8:30 A.M., then check in with the Admitting office.  Call this number if you have problems the morning of surgery:  [phone]  If you have any questions prior to your surgery date call [phone]: Open Monday-Friday 8am-4pm If you experience any cold or flu symptoms such as cough, fever, chills, shortness of breath, etc. between now and your scheduled surgery, please notify us at the above number.     Remember:  Do not eat after midnight the night before your surgery  You may drink clear liquids until 7:30 AM the morning of your surgery.   Clear liquids allowed are: Water, Non-Citrus Juices (without pulp), Carbonated Beverages, Clear Tea, Black Coffee Only (NO MILK, CREAM OR POWDERED CREAMER of any kind), and Gatorade.  Patient Instructions  The night before surgery:  No food after midnight. ONLY clear liquids after midnight  The day of surgery (if you do NOT have diabetes):  Drink ONE (1) Pre-Surgery Clear Ensure by 7:30 AM the morning of surgery. Drink in one sitting. Do not sip.  This drink was given to you during your hospital  pre-op appointment visit.  Nothing else to drink after completing the  Pre-Surgery Clear Ensure.         If you have questions, please contact your surgeon's office.     Take these medicines the morning of surgery with A SIP OF WATER:  cetirizine (ZYRTEC)    Follow your surgeon's instructions on when to stop Aspirin.  If no instructions were given by your surgeon then you will need to call the office to get those instructions.     As of today, STOP taking any Aleve, Naproxen, Ibuprofen, Motrin, Advil, Goody's, BC's, all herbal medications, fish oil, and all vitamins.                     Do NOT Smoke (Tobacco/Vaping) for 24 hours prior to your procedure.  If you use a CPAP at night, you may bring your mask/headgear for your  overnight stay.   Contacts, glasses, piercing's, hearing aid's, dentures or partials may not be worn into surgery, please bring cases for these belongings.    For patients admitted to the hospital, discharge time will be determined by your treatment team.   Patients discharged the day of surgery will not be allowed to drive home, and someone needs to stay with them for 24 hours.  SURGICAL WAITING ROOM VISITATION Patients having surgery or a procedure may have no more than 2 support people in the waiting area - these visitors may rotate.   Children under the age of 65 must have an adult with them who is not the patient. If the patient needs to stay at the hospital during part of their recovery, the visitor guidelines for inpatient rooms apply. Pre-op nurse will coordinate an appropriate time for 1 support person to accompany patient in pre-op.  This support person may not rotate.   Please refer to the [facility] website for the visitor guidelines for Inpatients (after your surgery is over and you are in a regular room).    Special instructions:   [place]- Preparing For Surgery  Before surgery, you can play an important role. Because skin is not sterile, your skin needs to be as free of germs as possible. You can reduce the number of germs on your skin by washing  with CHG (chlorahexidine gluconate) Soap before surgery.  CHG is an antiseptic cleaner which kills germs and bonds with the skin to continue killing germs even after washing.    Oral Hygiene is also important to reduce your risk of infection.  Remember - BRUSH YOUR TEETH THE MORNING OF SURGERY WITH YOUR REGULAR TOOTHPASTE  Please do not use if you have an allergy to CHG or antibacterial soaps. If your skin becomes reddened/irritated stop using the CHG.  Do not shave (including legs and underarms) for at least 48 hours prior to first CHG shower. It is OK to shave your face.  Please follow these instructions carefully.   Shower  the NIGHT BEFORE SURGERY and the MORNING OF SURGERY  If you chose to wash your hair, wash your hair first as usual with your normal shampoo.  After you shampoo, rinse your hair and body thoroughly to remove the shampoo.  Use CHG Soap as you would any other liquid soap. You can apply CHG directly to the skin and wash gently with a scrungie or a clean washcloth.   Apply the CHG Soap to your body ONLY FROM THE NECK DOWN.  Do not use on open wounds or open sores. Avoid contact with your eyes, ears, mouth and genitals (private parts). Wash Face and genitals (private parts)  with your normal soap.   Wash thoroughly, paying special attention to the area where your surgery will be performed.  Thoroughly rinse your body with warm water from the neck down.  DO NOT shower/wash with your normal soap after using and rinsing off the CHG Soap.  Pat yourself dry with a CLEAN TOWEL.  Wear CLEAN PAJAMAS to bed the night before surgery  Place CLEAN SHEETS on your bed the night before your surgery  DO NOT SLEEP WITH PETS.   Day of Surgery: Take a shower with CHG soap. Do not wear jewelry or makeup Do not wear lotions, powders, perfumes/colognes, or deodorant. Do not shave 48 hours prior to surgery.  Men may shave face and neck. Do not bring valuables to the hospital.  [facility] is not responsible for any belongings or valuables. Do not wear nail polish, gel polish, artificial nails, or any other type of covering on natural nails (fingers and toes) If you have artificial nails or gel coating that need to be removed by a nail salon, please have this removed prior to surgery. Artificial nails or gel coating may interfere with anesthesia's ability to adequately monitor your vital signs.  Wear Clean/Comfortable clothing the morning of surgery Remember to brush your teeth WITH YOUR REGULAR TOOTHPASTE.   Please read over the following fact sheets that you were given.    If you received a COVID test  during your pre-op visit  it is requested that you wear a mask when out in public, stay away from anyone that may not be feeling well and notify your surgeon if you develop symptoms. If you have been in contact with anyone that has tested positive in the last 10 days please notify you surgeon.

## 2022-11-26 ENCOUNTER — Other Ambulatory Visit: Payer: Self-pay

## 2022-11-26 ENCOUNTER — Encounter (HOSPITAL_COMMUNITY)
Admission: RE | Admit: 2022-11-26 | Discharge: 2022-11-26 | Disposition: A | Discharge status: Home / Self Care | Payer: Medicare HMO | Source: Ambulatory Visit | Attending: General Surgery | Admitting: General Surgery

## 2022-11-26 ENCOUNTER — Telehealth: Payer: Self-pay | Admitting: Radiology

## 2022-11-26 ENCOUNTER — Encounter (HOSPITAL_COMMUNITY): Payer: Self-pay

## 2022-11-26 VITALS — BP 131/69 | Temp 97.9°F | Resp 18 | Ht 67.0 in | Wt 182.1 lb

## 2022-11-26 DIAGNOSIS — I2510 Atherosclerotic heart disease of native coronary artery without angina pectoris: Secondary | ICD-10-CM | POA: Insufficient documentation

## 2022-11-26 DIAGNOSIS — Z9049 Acquired absence of other specified parts of digestive tract: Secondary | ICD-10-CM | POA: Insufficient documentation

## 2022-11-26 DIAGNOSIS — Z923 Personal history of irradiation: Secondary | ICD-10-CM | POA: Diagnosis not present

## 2022-11-26 DIAGNOSIS — Z853 Personal history of malignant neoplasm of breast: Secondary | ICD-10-CM | POA: Insufficient documentation

## 2022-11-26 DIAGNOSIS — C50912 Malignant neoplasm of unspecified site of left female breast: Secondary | ICD-10-CM | POA: Insufficient documentation

## 2022-11-26 DIAGNOSIS — Z87891 Personal history of nicotine dependence: Secondary | ICD-10-CM | POA: Insufficient documentation

## 2022-11-26 DIAGNOSIS — E785 Hyperlipidemia, unspecified: Secondary | ICD-10-CM | POA: Diagnosis not present

## 2022-11-26 DIAGNOSIS — I10 Essential (primary) hypertension: Secondary | ICD-10-CM | POA: Insufficient documentation

## 2022-11-26 DIAGNOSIS — R001 Bradycardia, unspecified: Secondary | ICD-10-CM | POA: Diagnosis not present

## 2022-11-26 DIAGNOSIS — Z01818 Encounter for other preprocedural examination: Secondary | ICD-10-CM | POA: Insufficient documentation

## 2022-11-26 NOTE — Progress Notes (Signed)
PCP - Dr. Maryjean Ka Cardiologist - Dr. Gypsy Balsam  PPM/ICD - Denies Device Orders - n/a Rep Notified - n/a  Chest x-ray - n/a EKG - 11/26/2022 Stress Test - Per pt, many years ago - result normal ECHO - Per pt, many years ago - result normal. Chest pain related to gallbladder and gallbladder removed. Cardiac Cath - Denies Coronary CT - 10/31/2021 - Pt lost her husband to cancer April 2023 and was having CP. MD ordered CT and attributed pain to stress of losing husband.  Sleep Study - Denies CPAP - n/a  No DM  Last dose of GLP1 agonist- n/a GLP1 instructions: n/a  Blood Thinner Instructions: n/a Aspirin Instructions: Pt instructed to reach out to Dr. Lonie Peak office to receive instructions on holding her ASA  ERAS Protcol - Clear liquids until 0730 morning of surgery PRE-SURGERY Ensure or G2- Ensure given to pt with instructions  COVID TEST- n/a   Anesthesia review: Yes. Breast seed placement 12/02/22.   Patient denies shortness of breath, fever, cough and chest pain at PAT appointment. Pt denies any respiratory illness/infection in the last two months. She does endorse occasional runny nose related to seasonal allergies. Pt instructed to let us know if she develops any additional symptoms such as productive cough, fever, and/or sore throat.   All instructions explained to the patient, with a verbal understanding of the material. Patient agrees to go over the instructions while at home for a better understanding. Patient also instructed to self quarantine after being tested for COVID-19. The opportunity to ask questions was provided.

## 2022-11-26 NOTE — Telephone Encounter (Signed)
[employer] 2021-05 - Specimen Collection Study to Evaluate Biomarkers in Subjects with Cancer    11/26/2022  PHONE CALL: Confirmed I was speaking with [name]. Informed patient reason for call is to follow-up on the above mentioned study. Patient expressed interest in participation. We coordinated a time for Monday 4/15 at 11:30AM. Thanked patient for her time and support of the above mentioned study.   Merri Brunette, RT(R)(T) Clinical Research Coordinator

## 2022-11-27 NOTE — Anesthesia Preprocedure Evaluation (Addendum)
Anesthesia Evaluation  Patient identified by MRN, date of birth, ID band Patient awake    Reviewed: Allergy & Precautions, NPO status , Patient's Chart, lab work & pertinent test results  History of Anesthesia Complications Negative for: history of anesthetic complications  Airway Mallampati: III  TM Distance: >3 FB Neck ROM: Full    Dental  (+) Dental Advisory Given   Pulmonary neg shortness of breath, neg sleep apnea, neg COPD, neg recent URI, former smoker   Pulmonary exam normal breath sounds clear to auscultation       Cardiovascular hypertension, (-) angina + CAD  (-) Past MI, (-) Cardiac Stents and (-) CABG (-) dysrhythmias  Rhythm:Regular Rate:Normal  HLD  Coronary CT 10/31/2021: IMPRESSION: 1. Coronary calcium score of 84.6 LAD 60.4, CX 17.1, RCA 7.17. This was 34 percentile for age and sex matched control.   2. Normal coronary origin with right dominance.   3. CAD-RADS 2. Mild non-obstructive CAD (25-49%) LAD, OM2, RCA. Consider non-atherosclerotic causes of chest pain. Consider preventive therapy and risk factor modification.    Neuro/Psych negative neurological ROS     GI/Hepatic negative GI ROS, Neg liver ROS,,,  Endo/Other  negative endocrine ROS    Renal/GU negative Renal ROS     Musculoskeletal osteopenia   Abdominal   Peds  Hematology negative hematology ROS (+)   Anesthesia Other Findings breast cancer  Reproductive/Obstetrics                             Anesthesia Physical Anesthesia Plan  ASA: 2  Anesthesia Plan: General and Regional   Post-op Pain Management: Tylenol PO (pre-op)* and Regional block*   Induction: Intravenous  PONV Risk Score and Plan: 3 and Ondansetron, Dexamethasone and Treatment may vary due to age or medical condition  Airway Management Planned: LMA  Additional Equipment:   Intra-op Plan:   Post-operative Plan: Extubation in  OR  Informed Consent: I have reviewed the patients History and Physical, chart, labs and discussed the procedure including the risks, benefits and alternatives for the proposed anesthesia with the patient or authorized representative who has indicated his/her understanding and acceptance.     Dental advisory given  Plan Discussed with: CRNA and Anesthesiologist  Anesthesia Plan Comments: (PAT note written 11/27/2022 by Shonna Chock, PA-C.  Discussed potential risks of nerve blocks including, but not limited to, infection, bleeding, nerve damage, seizures, pneumothorax, respiratory depression, and potential failure of the block. Alternatives to nerve blocks discussed. All questions answered.  Risks of general anesthesia discussed including, but not limited to, sore throat, hoarse voice, chipped/damaged teeth, injury to vocal cords, nausea and vomiting, allergic reactions, lung infection, heart attack, stroke, and death. All questions answered.   )       Anesthesia Quick Evaluation

## 2022-11-27 NOTE — Progress Notes (Signed)
Anesthesia Chart Review:   Case: [id number] Date/Time: 12/03/22 1015   Procedure: LEFT BREAST LUMPECTOMY WITH RADIOACTIVE SEED AND AXILLARY SENTINEL LYMPH NODE BIOPSY (Left: Breast)   Anesthesia type: General   Pre-op diagnosis: LEFT  BREAST CANCER   [facility]: [facility] ROOM [facility] / [facility]   Surgeons: Emelia Loron, MD       DISCUSSION: Patient is a 67 year old female scheduled for the above procedure.  History includes former smoker, HTN, HLD, allergic rhinitis, breast cancer (right breast cancer, s/p right partial mastectomy 04/11/09, s/p radiation; 11/06/22 left breast biopsy: invasive mammary carcinoma & in situ), cholecystectomy (2005).  She had evaluation with cardiologist Dr. Bing Matter in March 2023 for atypical chest pain in setting of CAD risk factors and significant stress related to caring for her husband with terminal GU cancer, who has since passed away. He ordered a coronary CT which was done on 10/31/21 and showed an elevated coronary calcium score of 84.6 (81st percentile) but mild non-obstructive CAD (25-49%) LAD, OM2, RCA. Chest symptoms were not felt to be cardiac. Preventative therapy and risk factor modification recommended.  11/26/22 EKG showed SB at 58 bpm. 11/20/22 labs from [facility] acceptable for OR.   RSL is scheduled for 12/02/22. Anesthesia team to evaluate on the day of surgery.    VS: BP 131/69   Temp 36.6 C (Oral)   Resp 18   Ht 5\' 7"  (1.702 m)   Wt 82.6 kg   SpO2 100%   BMI 28.52 kg/m   PROVIDERS: Street, Stephanie Coup, MD is PCP  Gypsy Balsam, MD is cardiologist Serena Croissant, MD is Heron Nay, MD is RAD-ONC   LABS: Most recent lab results in [facility] include: Lab Results  Component Value Date   WBC 6.5 11/20/2022   HGB 13.0 11/20/2022   HCT 39.5 11/20/2022   PLT 265 11/20/2022   GLUCOSE 122 (H) 11/20/2022   ALT 32 11/20/2022   AST 32 11/20/2022   NA 136 11/20/2022   K 4.4 11/20/2022   CL 97 (L) 11/20/2022   CREATININE 0.86 11/20/2022    BUN 28 (H) 11/20/2022   CO2 33 (H) 11/20/2022    IMAGES: CT Chest (over read CCTA) 10/31/21: FINDINGS: Vascular: None. Mediastinum/Nodes: None. Lungs/Pleura: Minimal dependent atelectasis.  No pleural fluid. Upper Abdomen: None. Musculoskeletal: Degenerative changes in the spine. IMPRESSION: No acute extracardiac findings.   EKG: 11/26/22: SB at 58 bpm   CV: CT Coronary 10/31/21: IMPRESSION: 1. Coronary calcium score of 84.6 LAD 60.4, CX 17.1, RCA 7.17. This was 31 percentile for age and sex matched control. 2. Normal coronary origin with right dominance. 3. CAD-RADS 2. Mild non-obstructive CAD (25-49%) LAD, OM2, RCA. Consider non-atherosclerotic causes of chest pain. Consider preventive therapy and risk factor modification.  She reported a remote history of normal echocardiogram ~ 2005.   Past Medical History:  Diagnosis Date   Breast cancer    Breast cancer, right breast 12/31/2012   Chronic allergic rhinitis due to pollen    Essential hypertension 10/08/2016   Hyperlipidemia    Obesity    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/2016    Past Surgical History:  Procedure Laterality Date   BREAST LUMPECTOMY  08/19/2008   right   CHOLECYSTECTOMY  08/20/2003   COLONOSCOPY  2015   TUBAL LIGATION      MEDICATIONS:  alendronate (FOSAMAX) 70 MG tablet   aspirin EC 81 MG tablet   cetirizine (ZYRTEC) 10 MG tablet   valsartan-hydrochlorothiazide (DIOVAN-HCT) 160-12.5 MG per tablet  No current facility-administered medications for this encounter.    Shonna Chock, PA-C Surgical Short Stay/Anesthesiology [facility] Phone [phone] [facility] Phone [phone] 11/27/2022 1:54 PM

## 2022-11-28 ENCOUNTER — Other Ambulatory Visit: Payer: Self-pay

## 2022-11-28 ENCOUNTER — Encounter: Payer: Self-pay | Admitting: Rehabilitation

## 2022-11-28 ENCOUNTER — Ambulatory Visit: Payer: Medicare HMO | Attending: Hematology and Oncology | Admitting: Rehabilitation

## 2022-11-28 ENCOUNTER — Telehealth: Payer: Self-pay | Admitting: *Deleted

## 2022-11-28 DIAGNOSIS — Z170 Estrogen receptor positive status [ER+]: Secondary | ICD-10-CM | POA: Insufficient documentation

## 2022-11-28 DIAGNOSIS — R293 Abnormal posture: Secondary | ICD-10-CM | POA: Insufficient documentation

## 2022-11-28 DIAGNOSIS — C50412 Malignant neoplasm of upper-outer quadrant of left female breast: Secondary | ICD-10-CM | POA: Diagnosis not present

## 2022-11-28 NOTE — Therapy (Deleted)
OUTPATIENT PHYSICAL THERAPY BREAST CANCER POST OP FOLLOW UP   Patient Name: [name] MRN: [id number] DOB:[birth year], 67 y.o., female Today's Date: 11/28/2022  END OF SESSION:   Past Medical History:  Diagnosis Date   Breast cancer    Breast cancer, right breast 12/31/2012   Chronic allergic rhinitis due to pollen    Essential hypertension 10/08/2016   Hyperlipidemia    Obesity    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/2016   Past Surgical History:  Procedure Laterality Date   BREAST LUMPECTOMY  08/19/2008   right   CHOLECYSTECTOMY  08/20/2003   COLONOSCOPY  2015   TUBAL LIGATION     Patient Active Problem List   Diagnosis Date Noted   Malignant neoplasm of upper-outer quadrant of left breast in female, estrogen receptor positive 11/18/2022   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 Casper Harrison  REFERRING PROVIDER: Dr. Pamelia Hoit   REFERRING DIAG: Left breast cancer   THERAPY DIAG:  No diagnosis found.  Rationale for Evaluation and Treatment: Rehabilitation  ONSET DATE: 11/06/22  SUBJECTIVE:                                                                                                                                                                                           SUBJECTIVE STATEMENT: ***  PERTINENT HISTORY:  Hx of Rt breast cancer with lumpectomy and radiation in 2010. Now with Lt breast cancer ILC grade 2 ER/PR positive Ki67% 10%.   PATIENT GOALS:  Reassess how my recovery is going related to arm function, pain, and swelling.  PAIN:  Are you having pain? {OPRCPAIN:27236}  PRECAUTIONS: Recent Surgery, {RIGHT/LEFT:21944} UE Lymphedema risk, {Therapy  precautions:24002}  ACTIVITY LEVEL / LEISURE: ***   OBJECTIVE:   PATIENT SURVEYS:  QUICK DASH: ***  OBSERVATIONS: ***  POSTURE:  ***  LYMPHEDEMA ASSESSMENT:   COPY ROM & LYMPHEDEMA ASSESSMENT TABLES FROM EVAL  Surgery type/Date: *** Number of lymph nodes removed: *** Current/past treatment (chemo, radiation, hormone therapy): *** Other symptoms:  Heaviness/tightness {yes/no:20286} Pain {yes/no:20286} Pitting edema {yes/no:20286} Infections {yes/no:20286} Decreased scar mobility {yes/no:20286} Stemmer sign {yes/no:20286}  PATIENT EDUCATION:  Education details: *** Person educated: {Person educated:25204} Education method: {Education Method:25205} Education comprehension: {Education Comprehension:25206}  HOME EXERCISE PROGRAM: Reviewed previously given post op HEP. ***  ASSESSMENT:  CLINICAL IMPRESSION: ***  Pt will benefit from  skilled therapeutic intervention to improve on the following deficits: Decreased knowledge of precautions, impaired UE functional use, pain, decreased ROM, postural dysfunction.   PT treatment/interventions: ADL/Self care home management, {rehab planned interventions:25118::"Therapeutic exercises","Therapeutic activity","Neuromuscular re-education","Balance training","Gait training","Patient/Family education","Self Care","Joint mobilization"}   GOALS: Goals reviewed with patient? {yes/no:20286}  LONG TERM GOALS:  (STG=LTG)  GOALS Name Target Date  Goal status  1 Pt will demonstrate she has regained full shoulder ROM and function post operatively compared to baselines.  Baseline: *** {GOALSTATUS:25110}  2  *** {GOALSTATUS:25110}  3  *** {GOALSTATUS:25110}  4  *** {GOALSTATUS:25110}     PLAN:  PT FREQUENCY/DURATION: ***  PLAN FOR NEXT SESSION: ***   [facility]  [address]  [place] [address]  [phone]  After Breast Cancer Class It is recommended you attend the ABC class to be  educated on lymphedema risk reduction. This class is free of charge and lasts for 1 hour. It is a 1-time class. You will need to download the Webex app either on your phone or computer. We will send you a link the night before or the morning of the class. You should be able to click on that link to join the class. This is not a confidential class. You don't have to turn your camera on, but other participants may be able to see your email address.  Scar massage You can begin gentle scar massage to you incision sites. Gently place one hand on the incision and move the skin (without sliding on the skin) in various directions. Do this for a few minutes and then you can gently massage either coconut oil or vitamin E cream into the scars.  Compression garment You should continue wearing your compression bra until you feel like you no longer have swelling.  Home exercise Program Continue doing the exercises you were given until you feel like you can do them without feeling any tightness at the end.   Walking Program Studies show that 30 minutes of walking per day (fast enough to elevate your heart rate) can significantly reduce the risk of a cancer recurrence. If you can't walk due to other medical reasons, we encourage you to find another activity you could do (like a stationary bike or water exercise).  Posture After breast cancer surgery, people frequently sit with rounded shoulders posture because it puts their incisions on slack and feels better. If you sit like this and scar tissue forms in that position, you can become very tight and have pain sitting or standing with good posture. Try to be aware of your posture and sit and stand up tall to heal properly.  Follow up PT: It is recommended you return every 3 months for the first 3 years following surgery to be assessed on the SOZO machine for an L-Dex score. This helps prevent clinically significant lymphedema in 95% of patients. These follow up  screens are 10 minute appointments that you are not billed for.  Idamae Lusher, PT 11/28/2022, 8:47 AM

## 2022-11-28 NOTE — Therapy (Signed)
OUTPATIENT PHYSICAL THERAPY BREAST CANCER BASELINE EVALUATION   Patient Name: [name] MRN: [id number] DOB:[birth year], 67 y.o., female Today's Date: 11/28/2022  END OF SESSION:  PT End of Session - 11/28/22 0930     Visit Number 1    Number of Visits 2    Date for PT Re-Evaluation 01/09/23    PT Start Time 0900    PT Stop Time 0930    PT Time Calculation (min) 30 min    Activity Tolerance Patient tolerated treatment well    Behavior During Therapy [facility] for tasks assessed/performed             Past Medical History:  Diagnosis Date   Breast cancer    Breast cancer, right breast 12/31/2012   Chronic allergic rhinitis due to pollen    Essential hypertension 10/08/2016   Hyperlipidemia    Obesity    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/2016   Past Surgical History:  Procedure Laterality Date   BREAST LUMPECTOMY  08/19/2008   right   CHOLECYSTECTOMY  08/20/2003   COLONOSCOPY  2015   TUBAL LIGATION     Patient Active Problem List   Diagnosis Date Noted   Malignant neoplasm of upper-outer quadrant of left breast in female, estrogen receptor positive 11/18/2022   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 Casper Harrison  REFERRING PROVIDER: Dr. Pamelia Hoit   REFERRING DIAG: Left breast cancer   THERAPY DIAG:  Malignant neoplasm of upper-outer quadrant of left breast in female, estrogen receptor positive  Abnormal posture  Rationale for Evaluation and Treatment: Rehabilitation  ONSET DATE: 11/18/22  SUBJECTIVE:                                                                                                                                                                                            SUBJECTIVE STATEMENT: Patient reports she is here today to be seen by her medical team for her newly diagnosed left breast cancer.   PERTINENT HISTORY:  Hx of Rt breast cancer with lumpectomy and radiation in 2010 with 6 negative nodes removed. Now with Lt breast cancer ILC grade 2 ER/PR positive Ki67% 10%. Hx of frozen shoulder bil.    PATIENT GOALS:   reduce lymphedema risk and learn post op HEP.   PAIN:  Are you having pain? No  PRECAUTIONS: Active  CA   HAND DOMINANCE: right  WEIGHT BEARING RESTRICTIONS: No  FALLS:  Has patient fallen in last 6 months? No  LIVING ENVIRONMENT: Patient lives with: alone  OCCUPATION: Part time desk/computer activities   LEISURE: walk    PRIOR LEVEL OF FUNCTION: Independent   OBJECTIVE:  COGNITION: Overall cognitive status: Within functional limits for tasks assessed    POSTURE:  Forward head and rounded shoulders posture  UPPER EXTREMITY AROM/PROM:  A/PROM RIGHT   eval   Shoulder extension 60  Shoulder flexion 146  Shoulder abduction 165  Shoulder internal rotation   Shoulder external rotation 90    (Blank rows = not tested)  A/PROM LEFT   eval  Shoulder extension 60  Shoulder flexion 160  Shoulder abduction 165  Shoulder internal rotation   Shoulder external rotation 90    (Blank rows = not tested)  UPPER EXTREMITY STRENGTH: 5/5 no pain   LYMPHEDEMA ASSESSMENTS:   LANDMARK RIGHT   eval  10 cm proximal to olecranon process 34.2  Olecranon process 26.7  10 cm proximal to ulnar styloid process 23.5  Just proximal to ulnar styloid process 16.5  Across hand at thumb web space 19.3  At base of 2nd digit 7.0  (Blank rows = not tested)  LANDMARK LEFT   eval  10 cm proximal to olecranon process 34  Olecranon process 27  10 cm proximal to ulnar styloid process 23.3  Just proximal to ulnar styloid process 16  Across hand at thumb web space 19  At base of 2nd digit 6.5  (Blank rows = not  tested)  L-DEX LYMPHEDEMA SCREENING:  The patient was assessed using the L-Dex machine today to produce a lymphedema index baseline score. The patient will be reassessed on a regular basis (typically every 3 months) to obtain new L-Dex scores. If the score is > 6.5 points away from his/her baseline score indicating onset of subclinical lymphedema, it will be recommended to wear a compression garment for 4 weeks, 12 hours per day and then be reassessed. If the score continues to be > 6.5 points from baseline at reassessment, we will initiate lymphedema treatment. Assessing in this manner has a 95% rate of preventing clinically significant lymphedema.    QUICK DASH SURVEY: 0%  PATIENT EDUCATION:  Education details: Lymphedema risk reduction and post op shoulder/posture HEP Person educated: Patient Education method: Explanation, Demonstration, Handout Education comprehension: Patient verbalized understanding and returned demonstration  HOME EXERCISE PROGRAM: Patient was instructed today in a home exercise program today for post op shoulder range of motion. These included active assist shoulder flexion in sitting, scapular retraction, wall walking with shoulder abduction, and hands behind head external rotation.  She was encouraged to do these twice a day, holding 3 seconds and repeating 5 times when permitted by her physician.   ASSESSMENT:  CLINICAL IMPRESSION: Pt will benefit from a post op PT reassessment to determine needs and from L-Dex screens every 3 months for 2 years to detect subclinical lymphedema. She has excellent status going into surgery and has no signs of edema in the Rt UE from the removal of 6 nodes in 2010.   Pt will benefit from skilled therapeutic intervention to improve on the following deficits: Decreased knowledge of precautions, impaired UE functional use, pain, decreased ROM, postural dysfunction.   PT treatment/interventions: ADL/self-care home management,  pt/family education, therapeutic exercise  REHAB POTENTIAL: Excellent  CLINICAL DECISION MAKING: Stable/uncomplicated  EVALUATION COMPLEXITY: Low   GOALS: Goals reviewed with patient? YES  LONG TERM GOALS: (STG=LTG)    Name Target Date Goal status  1 Pt will be able to verbalize understanding of pertinent lymphedema risk reduction practices relevant to her dx specifically related to skin care.  Baseline:  No knowledge 11/28/2022 Achieved at eval  2 Pt will be able to return demo and/or verbalize understanding of the post op HEP related to regaining shoulder ROM. Baseline:  No knowledge 11/28/2022 Achieved at eval  3 Pt will be able to verbalize understanding of the importance of attending the post op After Breast CA Class for further lymphedema risk reduction education and therapeutic exercise.  Baseline:  No knowledge 11/28/2022 Achieved at eval  4 Pt will demo she has regained full shoulder ROM and function post operatively compared to baselines.  Baseline: See objective measurements taken today. 01/09/23     PLAN:  PT FREQUENCY/DURATION: EVAL and 1 follow up appointment.   PLAN FOR NEXT SESSION: will reassess 3-4 weeks post op to determine needs.   Patient will follow up at outpatient cancer rehab 3-4 weeks following surgery.  If the patient requires physical therapy at that time, a specific plan will be dictated and sent to the referring physician for approval. The patient was educated today on appropriate basic range of motion exercises to begin post operatively and the importance of attending the After Breast Cancer class following surgery.  Patient was educated today on lymphedema risk reduction practices as it pertains to recommendations that will benefit the patient immediately following surgery.  She verbalized good understanding.    Physical Therapy Information for After Breast Cancer Surgery/Treatment:  Lymphedema is a swelling condition that you may be at risk for in your  arm if you have lymph nodes removed from the armpit area.  After a sentinel node biopsy, the risk is approximately 5-9% and is higher after an axillary node dissection.  There is treatment available for this condition and it is not life-threatening.  Contact your physician or physical therapist with concerns. You may begin the 4 shoulder/posture exercises (see additional sheet) when permitted by your physician (typically a week after surgery).  If you have drains, you may need to wait until those are removed before beginning range of motion exercises.  A general recommendation is to not lift your arms above shoulder height until drains are removed.  These exercises should be done to your tolerance and gently.  This is not a "no pain/no gain" type of recovery so listen to your body and stretch into the range of motion that you can tolerate, stopping if you have pain.  If you are having immediate reconstruction, ask your plastic surgeon about doing exercises as he or she may want you to wait. We encourage you to attend the free one time ABC (After Breast Cancer) class offered by [facility].  You will learn information related to lymphedema risk, prevention and treatment and additional exercises to regain mobility following surgery.  You can call [phone] for more information.  This is offered the 1st and 3rd Monday of each month.  You only attend the class one time. While undergoing any medical procedure or treatment, try to avoid blood pressure being taken or needle sticks from occurring on the arm on the side of cancer.   This recommendation begins after surgery and continues for the rest of your life.  This may help reduce your risk of getting lymphedema (swelling in your arm). An excellent resource for those seeking information on lymphedema is  the National Lymphedema Network's web site. It can be accessed at www.lymphnet.org If you notice swelling in your hand, arm or breast at  any time following surgery (even if it is many years from now), please contact your doctor or physical therapist to discuss this.  Lymphedema can be treated at any time but it is easier for you if it is treated early on.  If you feel like your shoulder motion is not returning to normal in a reasonable amount of time, please contact your surgeon or physical therapist.  [facility] [phone]. [address], [address], [place] [id number]  ABC CLASS After Breast Cancer Class  After Breast Cancer Class is a specially designed exercise class to assist you in a safe recover after having breast cancer surgery.  In this class you will learn how to get back to full function whether your drains were just removed or if you had surgery a month ago.  This one-time class is held the 1st and 3rd Monday of every month from 11:00 a.m. until 12:00 noon virtually.  This class is FREE and space is limited. For more information or to register for the next available class, call [phone].  Class Goals  Understand specific stretches to improve the flexibility of you chest and shoulder. Learn ways to safely strengthen your upper body and improve your posture. Understand the warning signs of infection and why you may be at risk for an arm infection. Learn about Lymphedema and prevention.  ** You do not attend this class until after surgery.  Drains must be removed to participate  Patient was instructed today in a home exercise program today for post op shoulder range of motion. These included active assist shoulder flexion in sitting, scapular retraction, wall walking with shoulder abduction, and hands behind head external rotation.  She was encouraged to do these twice a day, holding 3 seconds and repeating 5 times when permitted by her physician.    Idamae Lusherevis, Vadie Principato R, PT 11/28/2022, 9:31 AM

## 2022-11-28 NOTE — Telephone Encounter (Signed)
Left vm regarding [facility] from 4.3.24. Request return call with questions or needs.

## 2022-11-29 ENCOUNTER — Telehealth: Payer: Self-pay | Admitting: Hematology and Oncology

## 2022-11-29 NOTE — Telephone Encounter (Signed)
Spoke with patient confirming upcoming appointment  

## 2022-12-02 ENCOUNTER — Inpatient Hospital Stay: Payer: Medicare HMO | Admitting: Radiology

## 2022-12-02 ENCOUNTER — Inpatient Hospital Stay: Payer: Medicare HMO

## 2022-12-02 DIAGNOSIS — C50911 Malignant neoplasm of unspecified site of right female breast: Secondary | ICD-10-CM | POA: Diagnosis not present

## 2022-12-02 DIAGNOSIS — C50812 Malignant neoplasm of overlapping sites of left female breast: Secondary | ICD-10-CM | POA: Diagnosis not present

## 2022-12-02 DIAGNOSIS — C50412 Malignant neoplasm of upper-outer quadrant of left female breast: Secondary | ICD-10-CM

## 2022-12-02 DIAGNOSIS — C50912 Malignant neoplasm of unspecified site of left female breast: Secondary | ICD-10-CM | POA: Diagnosis not present

## 2022-12-02 LAB — RESEARCH LABS

## 2022-12-02 NOTE — Research (Unsigned)
[employer] 2021-05 - Specimen Collection Study to Evaluate Biomarkers in Subjects with Cancer    12/02/22  ELIGIBILITY:  This Coordinator has reviewed this patient's inclusion and exclusion criteria and confirmed [name] is eligible for study participation.  Patient will continue with enrollment.  Menopausal status (women only): [name] is post-menopausal.  Eligibility confirmed by treating investigator, who also agrees that patient should proceed with enrollment.   CONSENT: Patient was previously provided a copy of the consent documents and confirmed she has reviewed them.   Patient [name] was identified by Dr. Pamelia Hoit as a potential candidate for the above listed study.  This Clinical Research Coordinator met with [name], [id number] on 12/02/22 in a manner and location that ensures patient privacy to discuss participation in the above listed research study.  Patient is Unaccompanied.  Patient was previously provided with informed consent documents.  Patient confirmed they have read the informed consent documents.  As outlined in the informed consent form, this Coordinator and [name] discussed the purpose of the research study, the investigational nature of the study, study procedures and requirements for study participation, potential risks and benefits of study participation, as well as alternatives to participation.  This study is not blinded or double-blinded. The patient understands participation is voluntary and they may withdraw from study participation at any time.  This study does not involve randomization.  This study does not involve an investigational drug or device. This study does not involve a placebo. Patient understands enrollment is pending full eligibility review.   Confidentiality and how the patient's information will be used as part of study participation were discussed.  Patient was informed there is reimbursement provided for their time  and effort spent on trial participation.  The patient is encouraged to discuss research study participation with their insurance provider to determine what costs they may incur as part of study participation, including research related injury.    All questions were answered to patient's satisfaction.  The informed consent with embedded HIPAA language was reviewed page by page.  The patient's mental and emotional status is appropriate to provide informed consent, and the patient verbalizes an understanding of study participation.  Patient has agreed to participate in the above listed research study and has voluntarily signed the informed consent version dated 01 Sep 2020 with embedded HIPAA language, version dated 01 Sep 2020  on 12/02/22 at 1116AM.  The patient was provided with a copy of the signed informed consent form with embedded HIPAA language for their reference.  No study specific procedures were obtained prior to the signing of the informed consent document.  Approximately 20 minutes were spent with the patient reviewing the informed consent documents.  Patient was not requested to complete a Release of Information form.   After completion of consent documents, medical history was obtained as below:   Medical History:  High Blood Pressure  Yes Coronary Artery Disease No Lupus    No Rheumatoid Arthritis  No Diabetes   No      Lynch Syndrome  No  Is the patient currently taking a magnesium supplement?   No  Does the patient have a personal history of cancer (greater than 5 years ago)?  Yes If yes, Cancer type and date of diagnosis?   Breast; August 2010  Has this previous diagnosis been treated?   YES If so, treatment type? Surgery + radiation Start and end dates of last treatment cycle? Surgery--August 2010; Radiation  October 2010.   Does the patient have a family history of cancer in 1st or 2nd degree relatives? Yes If yes, Relationship(s) and Cancer type(s)? Dad: renal  Does the  patient have history of alcohol consumption? Yes   If yes, current or former? current Number of years? 40 yrs Drinks per week? Less than 1   Does the patient have history of cigarette, cigar, pipe, or chewing tobacco use?  Yes  If yes, current for former? former If yes, type (Cigarette, cigar, pipe, and/or chewing tobacco)? Cigarette   If former, year stopped? 1982 Number of years? 4 Packs/number/containers per day? Less than pack (0.5)   After completion of medical history, patient was escorted to the lab by Suezanne Cheshire, Research assistant. Patient was thanked for her time and support of the above mentioned study. Patient was encouraged to call this coordinator for any future questions or concerns.  Blood Collection: Research blood obtained by  Fresh venipuncture. Patient tolerated well without any adverse events. Gift Card: $50 gift card given to patient for her participation in this study.    Merri Brunette, RT(R)(T) Clinical Research Coordinator

## 2022-12-02 NOTE — Research (Unsigned)
[employer] 2021-05 - Specimen Collection Study to Evaluate Biomarkers in Subjects with Cancer   This Nurse has reviewed this patient's inclusion and exclusion criteria as a second review and confirms [name] is eligible for study participation.  Patient may continue with enrollment.  Domenica Reamer, BSN, RN, [employer] Research Nurse II [phone] 12/02/2022 11:36 AM

## 2022-12-03 ENCOUNTER — Encounter (HOSPITAL_COMMUNITY): Payer: Self-pay | Admitting: General Surgery

## 2022-12-03 ENCOUNTER — Ambulatory Visit (HOSPITAL_BASED_OUTPATIENT_CLINIC_OR_DEPARTMENT_OTHER): Payer: Medicare HMO | Admitting: Anesthesiology

## 2022-12-03 ENCOUNTER — Ambulatory Visit (HOSPITAL_COMMUNITY): Payer: Medicare HMO | Admitting: Vascular Surgery

## 2022-12-03 ENCOUNTER — Ambulatory Visit (HOSPITAL_COMMUNITY)
Admission: RE | Admit: 2022-12-03 | Discharge: 2022-12-03 | Disposition: A | Discharge status: Home / Self Care | Payer: Medicare HMO | Attending: General Surgery | Admitting: General Surgery

## 2022-12-03 ENCOUNTER — Encounter (HOSPITAL_COMMUNITY)
Admission: RE | Disposition: A | Discharge status: Home / Self Care | Payer: Self-pay | Source: Home / Self Care | Attending: General Surgery

## 2022-12-03 ENCOUNTER — Other Ambulatory Visit: Payer: Self-pay

## 2022-12-03 DIAGNOSIS — Z170 Estrogen receptor positive status [ER+]: Secondary | ICD-10-CM | POA: Insufficient documentation

## 2022-12-03 DIAGNOSIS — C50512 Malignant neoplasm of lower-outer quadrant of left female breast: Secondary | ICD-10-CM | POA: Insufficient documentation

## 2022-12-03 DIAGNOSIS — I2510 Atherosclerotic heart disease of native coronary artery without angina pectoris: Secondary | ICD-10-CM | POA: Diagnosis not present

## 2022-12-03 DIAGNOSIS — R9230 Dense breasts, unspecified: Secondary | ICD-10-CM | POA: Diagnosis not present

## 2022-12-03 DIAGNOSIS — G8918 Other acute postprocedural pain: Secondary | ICD-10-CM | POA: Diagnosis not present

## 2022-12-03 DIAGNOSIS — Z87891 Personal history of nicotine dependence: Secondary | ICD-10-CM | POA: Insufficient documentation

## 2022-12-03 DIAGNOSIS — I10 Essential (primary) hypertension: Secondary | ICD-10-CM | POA: Insufficient documentation

## 2022-12-03 DIAGNOSIS — C50912 Malignant neoplasm of unspecified site of left female breast: Secondary | ICD-10-CM

## 2022-12-03 HISTORY — PX: BREAST LUMPECTOMY WITH RADIOACTIVE SEED AND SENTINEL LYMPH NODE BIOPSY: SHX6550

## 2022-12-03 HISTORY — PX: AXILLARY SENTINEL NODE BIOPSY: SHX5738

## 2022-12-03 SURGERY — BREAST LUMPECTOMY WITH RADIOACTIVE SEED AND SENTINEL LYMPH NODE BIOPSY
Anesthesia: Regional | Site: Breast | Laterality: Left

## 2022-12-03 MED ORDER — CHLORHEXIDINE GLUCONATE CLOTH 2 % EX PADS: 6.0000 | MEDICATED_PAD | Freq: Once | CUTANEOUS | Status: DC

## 2022-12-03 MED ORDER — MIDAZOLAM HCL 2 MG/2ML IJ SOLN: 2.0000 mg | Freq: Once | INTRAMUSCULAR | Status: AC

## 2022-12-03 MED ORDER — PROPOFOL 10 MG/ML IV BOLUS
INTRAVENOUS | Status: AC
  Filled 2022-12-03: qty 20

## 2022-12-03 MED ORDER — CEFAZOLIN SODIUM-DEXTROSE 2-4 GM/100ML-% IV SOLN
2.0000 g | INTRAVENOUS | Status: AC
  Administered 2022-12-03: 2 g via INTRAVENOUS
  Filled 2022-12-03: qty 100

## 2022-12-03 MED ORDER — LIDOCAINE 2% (20 MG/ML) 5 ML SYRINGE
INTRAMUSCULAR | Status: DC | PRN
  Administered 2022-12-03: 100 mg via INTRAVENOUS

## 2022-12-03 MED ORDER — FENTANYL CITRATE (PF) 100 MCG/2ML IJ SOLN
INTRAMUSCULAR | Status: AC
  Filled 2022-12-03: qty 2

## 2022-12-03 MED ORDER — CHLORHEXIDINE GLUCONATE 0.12 % MT SOLN
15.0000 mL | Freq: Once | OROMUCOSAL | Status: AC
  Administered 2022-12-03: 15 mL via OROMUCOSAL
  Filled 2022-12-03: qty 15

## 2022-12-03 MED ORDER — FENTANYL CITRATE (PF) 250 MCG/5ML IJ SOLN
INTRAMUSCULAR | Status: DC | PRN
  Administered 2022-12-03: 50 ug via INTRAVENOUS

## 2022-12-03 MED ORDER — OXYCODONE HCL 5 MG/5ML PO SOLN: 5.0000 mg | Freq: Once | ORAL | Status: DC | PRN

## 2022-12-03 MED ORDER — BUPIVACAINE HCL (PF) 0.25 % IJ SOLN
INTRAMUSCULAR | Status: AC
  Filled 2022-12-03: qty 30

## 2022-12-03 MED ORDER — PHENYLEPHRINE HCL-NACL 20-0.9 MG/250ML-% IV SOLN
INTRAVENOUS | Status: DC | PRN
  Administered 2022-12-03: 50 ug/min via INTRAVENOUS

## 2022-12-03 MED ORDER — MIDAZOLAM HCL 2 MG/2ML IJ SOLN
INTRAMUSCULAR | Status: AC
  Administered 2022-12-03: 2 mg via INTRAVENOUS
  Filled 2022-12-03: qty 2

## 2022-12-03 MED ORDER — OXYCODONE HCL 5 MG PO TABS: 5.0000 mg | ORAL_TABLET | Freq: Once | ORAL | Status: DC | PRN

## 2022-12-03 MED ORDER — HEMOSTATIC AGENTS (NO CHARGE) OPTIME
TOPICAL | Status: DC | PRN
  Administered 2022-12-03: 1 via TOPICAL

## 2022-12-03 MED ORDER — DEXAMETHASONE SODIUM PHOSPHATE 10 MG/ML IJ SOLN
INTRAMUSCULAR | Status: DC | PRN
  Administered 2022-12-03: 10 mg via INTRAVENOUS

## 2022-12-03 MED ORDER — PROPOFOL 10 MG/ML IV BOLUS
INTRAVENOUS | Status: DC | PRN
  Administered 2022-12-03: 150 mg via INTRAVENOUS

## 2022-12-03 MED ORDER — ONDANSETRON HCL 4 MG/2ML IJ SOLN
INTRAMUSCULAR | Status: DC | PRN
  Administered 2022-12-03: 4 mg via INTRAVENOUS

## 2022-12-03 MED ORDER — ENSURE PRE-SURGERY PO LIQD: 296.0000 mL | Freq: Once | ORAL | Status: DC

## 2022-12-03 MED ORDER — AMISULPRIDE (ANTIEMETIC) 5 MG/2ML IV SOLN
10.0000 mg | Freq: Once | INTRAVENOUS | Status: AC | PRN
  Administered 2022-12-03: 10 mg via INTRAVENOUS

## 2022-12-03 MED ORDER — AMISULPRIDE (ANTIEMETIC) 5 MG/2ML IV SOLN
INTRAVENOUS | Status: AC
  Filled 2022-12-03: qty 4

## 2022-12-03 MED ORDER — ACETAMINOPHEN 500 MG PO TABS
1000.0000 mg | ORAL_TABLET | ORAL | Status: AC
  Administered 2022-12-03: 1000 mg via ORAL
  Filled 2022-12-03: qty 2

## 2022-12-03 MED ORDER — ORAL CARE MOUTH RINSE: 15.0000 mL | Freq: Once | OROMUCOSAL | Status: AC

## 2022-12-03 MED ORDER — MAGTRACE LYMPHATIC TRACER
INTRAMUSCULAR | Status: DC | PRN
  Administered 2022-12-03: 2 mL via INTRAMUSCULAR

## 2022-12-03 MED ORDER — EPHEDRINE SULFATE-NACL 50-0.9 MG/10ML-% IV SOSY
PREFILLED_SYRINGE | INTRAVENOUS | Status: DC | PRN
  Administered 2022-12-03 (×2): 5 mg via INTRAVENOUS

## 2022-12-03 MED ORDER — LACTATED RINGERS IV SOLN: INTRAVENOUS | Status: DC

## 2022-12-03 MED ORDER — FENTANYL CITRATE (PF) 100 MCG/2ML IJ SOLN: 25.0000 ug | INTRAMUSCULAR | Status: DC | PRN

## 2022-12-03 MED ORDER — BUPIVACAINE HCL 0.25 % IJ SOLN
INTRAMUSCULAR | Status: DC | PRN
  Administered 2022-12-03: 2 mL

## 2022-12-03 MED ORDER — 0.9 % SODIUM CHLORIDE (POUR BTL) OPTIME
TOPICAL | Status: DC | PRN
  Administered 2022-12-03: 1000 mL

## 2022-12-03 MED ORDER — BUPIVACAINE HCL (PF) 0.25 % IJ SOLN
INTRAMUSCULAR | Status: DC | PRN
  Administered 2022-12-03: 30 mL via PERINEURAL

## 2022-12-03 MED ORDER — TRAMADOL HCL 50 MG PO TABS: 50.0000 mg | ORAL_TABLET | Freq: Four times a day (QID) | ORAL | 0 refills | Status: DC | PRN

## 2022-12-03 MED ORDER — FENTANYL CITRATE (PF) 250 MCG/5ML IJ SOLN
INTRAMUSCULAR | Status: AC
  Filled 2022-12-03: qty 5

## 2022-12-03 SURGICAL SUPPLY — 50 items
ADH SKN CLS APL DERMABOND .7 (GAUZE/BANDAGES/DRESSINGS) ×4
APL PRP STRL LF DISP 70% ISPRP (MISCELLANEOUS) ×2
APPLIER CLIP 9.375 MED OPEN (MISCELLANEOUS) ×2
APR CLP MED 9.3 20 MLT OPN (MISCELLANEOUS) ×2
BAG COUNTER SPONGE SURGICOUNT (BAG) ×2 IMPLANT
BAG SPNG CNTER NS LX DISP (BAG) ×2
BINDER BREAST LRG (GAUZE/BANDAGES/DRESSINGS) IMPLANT
BINDER BREAST XLRG (GAUZE/BANDAGES/DRESSINGS) IMPLANT
CANISTER SUCT 3000ML PPV (MISCELLANEOUS) ×2 IMPLANT
CHLORAPREP W/TINT 26 (MISCELLANEOUS) ×2 IMPLANT
CLIP APPLIE 9.375 MED OPEN (MISCELLANEOUS) IMPLANT
CLIP TI MEDIUM 6 (CLIP) ×2 IMPLANT
COVER PROBE W GEL 5X96 (DRAPES) ×4 IMPLANT
COVER SURGICAL LIGHT HANDLE (MISCELLANEOUS) ×2 IMPLANT
DERMABOND ADVANCED .7 DNX12 (GAUZE/BANDAGES/DRESSINGS) ×2 IMPLANT
DEVICE DUBIN SPECIMEN MAMMOGRA (MISCELLANEOUS) ×2 IMPLANT
DRAPE CHEST BREAST 15X10 FENES (DRAPES) ×2 IMPLANT
ELECT COATED BLADE 2.86 ST (ELECTRODE) ×2 IMPLANT
ELECT REM PT RETURN 9FT ADLT (ELECTROSURGICAL) ×2
ELECTRODE REM PT RTRN 9FT ADLT (ELECTROSURGICAL) ×2 IMPLANT
GLOVE BIO SURGEON STRL SZ7 (GLOVE) ×2 IMPLANT
GLOVE BIOGEL PI IND STRL 7.5 (GLOVE) ×2 IMPLANT
GOWN STRL REUS W/ TWL LRG LVL3 (GOWN DISPOSABLE) ×4 IMPLANT
GOWN STRL REUS W/TWL LRG LVL3 (GOWN DISPOSABLE) ×4
HEMOSTAT ARISTA ABSORB 3G PWDR (HEMOSTASIS) IMPLANT
ILLUMINATOR WAVEGUIDE N/F (MISCELLANEOUS) IMPLANT
KIT BASIN OR (CUSTOM PROCEDURE TRAY) ×2 IMPLANT
KIT MARKER MARGIN INK (KITS) ×2 IMPLANT
NDL 18GX1X1/2 (RX/OR ONLY) (NEEDLE) IMPLANT
NDL FILTER BLUNT 18X1 1/2 (NEEDLE) IMPLANT
NDL HYPO 25GX1X1/2 BEV (NEEDLE) ×2 IMPLANT
NEEDLE 18GX1X1/2 (RX/OR ONLY) (NEEDLE) IMPLANT
NEEDLE FILTER BLUNT 18X1 1/2 (NEEDLE) IMPLANT
NEEDLE HYPO 25GX1X1/2 BEV (NEEDLE) ×2 IMPLANT
NS IRRIG 1000ML POUR BTL (IV SOLUTION) ×2 IMPLANT
PACK GENERAL/GYN (CUSTOM PROCEDURE TRAY) ×2 IMPLANT
RETRACTOR ONETRAX LX 90X20 (MISCELLANEOUS) IMPLANT
STRIP CLOSURE SKIN 1/2X4 (GAUZE/BANDAGES/DRESSINGS) ×2 IMPLANT
SUT MNCRL AB 4-0 PS2 18 (SUTURE) ×4 IMPLANT
SUT MON AB 5-0 P3 18 (SUTURE) IMPLANT
SUT MON AB 5-0 PS2 18 (SUTURE) IMPLANT
SUT SILK 2 0 SH (SUTURE) IMPLANT
SUT VIC AB 2-0 SH 27 (SUTURE) ×6
SUT VIC AB 2-0 SH 27XBRD (SUTURE) ×4 IMPLANT
SUT VIC AB 3-0 SH 27 (SUTURE) ×4
SUT VIC AB 3-0 SH 27X BRD (SUTURE) ×4 IMPLANT
SUT VIC AB 5-0 PS2 18 (SUTURE) IMPLANT
SYR CONTROL 10ML LL (SYRINGE) ×2 IMPLANT
TOWEL GREEN STERILE (TOWEL DISPOSABLE) ×2 IMPLANT
TOWEL GREEN STERILE FF (TOWEL DISPOSABLE) ×2 IMPLANT

## 2022-12-03 NOTE — Anesthesia Postprocedure Evaluation (Signed)
Anesthesia Post Note  Patient: LORIBETH KATICH  Procedure(s) Performed: LEFT BREAST LUMPECTOMY WITH RADIOACTIVE SEED (Left: Breast) LEFT AXILLARY SENTINEL NODE BIOPSY (Left: Axilla)     Patient location during evaluation: PACU Anesthesia Type: Regional and General Level of consciousness: awake Pain management: pain level controlled Vital Signs Assessment: post-procedure vital signs reviewed and stable Respiratory status: spontaneous breathing, nonlabored ventilation and respiratory function stable Cardiovascular status: blood pressure returned to baseline and stable Postop Assessment: no apparent nausea or vomiting Anesthetic complications: no   No notable events documented.  Last Vitals:  Vitals:   12/03/22 1100 12/03/22 1115  BP: 131/68 (!) 151/70  Pulse: 60 (!) 56  Resp: 12 17  Temp:  36.4 C  SpO2: 99% 97%    Last Pain:  Vitals:   12/03/22 1049  TempSrc:   PainSc: 0-No pain                 Linton Rump

## 2022-12-03 NOTE — Op Note (Signed)
Preoperative diagnosis:  Clinical stage I left breast cancer Postoperative diagnosis: Same as above Procedure: 1.  Injection of mag trace for sentinel lymph node identification 2.  Left breast radioactive seed guided lumpectomy 3.  Left deep axillary sentinel lymph node biopsy Surgeon: Dr. Harden Mo Anesthesia: General with pec block Estimated blood loss: 75 cc Drains: None Specimens: 1.  Left breast tissue marked with paint containing seed and clip 2.  Left deep axillary sentinel lymph nodes 3. Additional left breast posterior, medial and lateral margins marked short superior, long lateral double deep Complications: None Sponge needle count was correct completion Disposition recovery stable addition   Indications: 63 yof with prior right breast cancer treated with lumpectomy/xrt/AI in 2010. She had a screening detected mass noted in the left lower outer quadrant. She has B density breast tissue. Mass is 5x4x3 mm on [place]. Axilla is negative. She had a biopsy that shows grade II ILC with LCIS that is 100% er pos, 70% pr pos, her 2 negative and Ki is 10%. We discussed lumpectomy and sn biopsy.    Procedure: After informed sent was obtained she first underwent a pectoral block.  She had a seed placed.  I had these mammograms in the operating room.  She was then placed under general anesthesia without complication.  She was prepped and draped in standard sterile surgical fashion.  Surgical timeout was performed.   I first injected the mag trace in the subareolar position.  I injected 2 cc of mag trace in the subareolar position.  This was massaged for about 5 minutes.    I made a periareolar incision in order to hide the scar later. I then used the neoprobe again to locate the seed and removed the seed in the surrounding tissue with an attempt to get a clear margin.  Mammogram confirmed removal of the clip and the seed.  3D imaging showed that it look liked like I might be close to several  margins so I removed these as well.   Clips were placed in the cavity.  Hemostasis was obtained.  I closed down the breast tissue with 2-0 Vicryl.  The skin was closed with 3-0 Vicryl and 4-0 Monocryl.   I then made an incision right below the axillary hairline.  I carried this to the axillary fascia into the axilla.  I was able to identify the sentinel node or nodes and removed these.   There was no additional activity or any abnormal nodes present.  I obtained hemostasis.  I did place some Arista in the axilla.  I closed the axillary fascia with 2-0 Vicryl.  Skin was closed with 3-0 Vicryl and 4-0 Monocryl.  Glue insertion was applied.  She tolerated all this well was extubated transferred recovery stable

## 2022-12-03 NOTE — Transfer of Care (Signed)
Immediate Anesthesia Transfer of Care Note  Patient: [name]  Procedure(s) Performed: LEFT BREAST LUMPECTOMY WITH RADIOACTIVE SEED (Left: Breast) LEFT AXILLARY SENTINEL NODE BIOPSY (Left: Axilla)  Patient Location: PACU  Anesthesia Type:GA combined with regional for post-op pain  Level of Consciousness: drowsy and patient cooperative  Airway & Oxygen Therapy: Patient Spontanous Breathing and Patient connected to face mask oxygen  Post-op Assessment: Report given to RN and Post -op Vital signs reviewed and stable  Post vital signs: Reviewed and stable  Last Vitals:  Vitals Value Taken Time  BP 136/66 12/03/22 1049  Temp    Pulse 62 12/03/22 1050  Resp 11 12/03/22 1050  SpO2 99 % 12/03/22 1050  Vitals shown include unvalidated device data.  Last Pain:  Vitals:   12/03/22 0913  TempSrc:   PainSc: 0-No pain      Patients Stated Pain Goal: 0 (12/03/22 0913)  Complications: No notable events documented.

## 2022-12-03 NOTE — Anesthesia Procedure Notes (Signed)
Procedure Name: LMA Insertion Date/Time: 12/03/2022 9:32 AM  Performed by: Randon Goldsmith, CRNAPre-anesthesia Checklist: Patient identified, Emergency Drugs available, Suction available and Patient being monitored Patient Re-evaluated:Patient Re-evaluated prior to induction Oxygen Delivery Method: Circle system utilized Preoxygenation: Pre-oxygenation with 100% oxygen Induction Type: IV induction Ventilation: Mask ventilation without difficulty LMA: LMA inserted LMA Size: 4.0 Number of attempts: 1 Airway Equipment and Method: Bite block Placement Confirmation: positive ETCO2 and breath sounds checked- equal and bilateral Tube secured with: Tape Dental Injury: Teeth and Oropharynx as per pre-operative assessment

## 2022-12-03 NOTE — Interval H&P Note (Signed)
History and Physical Interval Note:  12/03/2022 8:32 AM  [name]  has presented today for surgery, with the diagnosis of LEFT  BREAST CANCER.  The various methods of treatment have been discussed with the patient and family. After consideration of risks, benefits and other options for treatment, the patient has consented to  Procedure(s): LEFT BREAST LUMPECTOMY WITH RADIOACTIVE SEED AND AXILLARY SENTINEL LYMPH NODE BIOPSY (Left) as a surgical intervention.  The patient's history has been reviewed, patient examined, no change in status, stable for surgery.  I have reviewed the patient's chart and labs.  Questions were answered to the patient's satisfaction.     [name]

## 2022-12-03 NOTE — H&P (Signed)
67 yof with prior right breast cancer treated with lumpectomy/xrt/AI in 2010. She had a screening detected mass noted in the left lower outer quadrant. She has B density breast tissue. Mass is 5x4x3 mm on [place]. Axilla is negative. She had a biopsy that shows grade II ILC with LCIS that is 100% er pos, 70% pr pos, her 2 negative and Ki is 10%.  Review of Systems: A complete review of systems was obtained from the patient. I have reviewed this information and discussed as appropriate with the patient. See HPI as well for other ROS.  Review of Systems All other systems reviewed and are negative.  Medical History: Past Medical History: Diagnosis Date History of cancer Hypertension  Past Surgical History: Procedure Laterality Date CHOLECYSTECTOMY 2005 Breast Lumpectomy 2010  No Known Allergies  Current Outpatient Medications on File Prior to Visit Medication Sig Dispense Refill alendronate (FOSAMAX) 70 MG tablet Take by mouth (Patient not taking: Reported on 11/20/2022) aspirin 81 MG EC tablet Take by mouth valsartan-hydroCHLOROthiazide (DIOVAN-HCT) 160-12.5 mg tablet Take 1 tablet by mouth   Family History Problem Relation Age of Onset Stroke Mother Deep vein thrombosis (DVT or abnormal blood clot formation) Mother High blood pressure (Hypertension) Mother Diabetes Mother Hyperlipidemia (Elevated cholesterol) Mother Diabetes Father Coronary Artery Disease (Blocked arteries around heart) Father Kidney cancer Father Bladder Cancer Father Coronary Artery Disease (Blocked arteries around heart) Brother   Social History  Tobacco Use Smoking Status Former Types: Cigarettes Quit date: 1999 Years since quitting: 25.2 Smokeless Tobacco Never Marital status: Married Tobacco Use Smoking status: Former Types: Cigarettes Quit date: 1999 Years since quitting: 25.2 Smokeless tobacco: Never Substance and Sexual Activity Alcohol use: Yes Comment: Occasionally Drug use:  Never  Objective:  Physical Exam Vitals reviewed. Constitutional: Appearance: Normal appearance. Chest: Breasts: Right: No inverted nipple, mass or nipple discharge. Left: No inverted nipple, mass or nipple discharge. Lymphadenopathy: Upper Body: Right upper body: No supraclavicular or axillary adenopathy. Left upper body: No supraclavicular or axillary adenopathy. Neurological: Mental Status: She is alert.   Assessment and Plan:  Malignant neoplasm of lower-outer quadrant of left breast of female, estrogen receptor positive (CMS/HHS-HCC)  Left breast seed guided lumpectomy, left ax sn biopsy  We discussed the staging and pathophysiology of breast cancer. We discussed all of the different options for treatment for breast cancer including surgery, chemotherapy, radiation therapy, Herceptin, and antiestrogen therapy.  We discussed a sentinel lymph node biopsy as she does not appear to having lymph node involvement right now. We discussed the performance of that with injection of Magtrace and small risk of discoloration. We discussed that there is a chance of having a positive node with a sentinel lymph node biopsy and we will await the permanent pathology to make any other first further decisions in terms of her treatment. We discussed up to a 5% risk lifetime of chronic shoulder pain as well as lymphedema associated with a sentinel lymph node biopsy.  We discussed the options for treatment of the breast cancer which included lumpectomy versus a mastectomy. We discussed the performance of the lumpectomy with radioactive seed placement. We discussed a 5-10% chance of a positive margin requiring reexcision in the operating room. We also discussed that she will likely need radiation therapy if she undergoes lumpectomy. We discussed mastectomy and the postoperative care for that as well. Mastectomy can be followed by reconstruction. The decision for lumpectomy vs mastectomy has no impact on  decision for chemotherapy. Most mastectomy patients will not need radiation therapy.  We discussed that there is no difference in her survival whether she undergoes lumpectomy with radiation therapy or antiestrogen therapy versus a mastectomy. There is also no real difference between her recurrence in the breast.  We discussed the risks of operation including bleeding, infection, possible reoperation. She understands her further therapy will be based on what her stages at the time of her operation.

## 2022-12-03 NOTE — Anesthesia Procedure Notes (Signed)
Anesthesia Regional Block: Pectoralis block   Pre-Anesthetic Checklist: , timeout performed,  Correct Patient, Correct Site, Correct Laterality,  Correct Procedure, Correct Position, site marked,  Risks and benefits discussed,  Surgical consent,  Pre-op evaluation,  At surgeon's request and post-op pain management  Laterality: Left  Prep: chloraprep       Needles:  Injection technique: Single-shot  Needle Type: Echogenic Stimulator Needle     Needle Length: 9cm  Needle Gauge: 21     Additional Needles:   Procedures:,,,, ultrasound used (permanent image in chart),,    Narrative:  Start time: 12/03/2022 8:59 AM End time: 12/03/2022 9:02 AM Injection made incrementally with aspirations every 5 mL.  Performed by: Personally  Anesthesiologist: Linton Rump, MD  Additional Notes: Discussed risks and benefits of nerve block including, but not limited to, prolonged and/or permanent nerve injury involving sensory and/or motor function. Monitors were applied and a time-out was performed. The nerve and associated structures were visualized under ultrasound guidance. After negative aspiration, local anesthetic was slowly injected around the nerve. There was no evidence of high pressure during the procedure. There were no paresthesias. VSS remained stable and the patient tolerated the procedure well.

## 2022-12-03 NOTE — Discharge Instructions (Signed)
[facility] Office Phone Number [phone]  POST OP INSTRUCTIONS Take 400 mg of ibuprofen every 8 hours or 650 mg tylenol every 6 hours for next 72 hours then as needed. Use ice several times daily also.  A prescription for pain medication may be given to you upon discharge.  Take your pain medication as prescribed, if needed.  If narcotic pain medicine is not needed, then you may take acetaminophen (Tylenol), naprosyn (Alleve) or ibuprofen (Advil) as needed. Take your usually prescribed medications unless otherwise directed If you need a refill on your pain medication, please contact your pharmacy.  They will contact our office to request authorization.  Prescriptions will not be filled after 5pm or on week-ends. You should eat very light the first 24 hours after surgery, such as soup, crackers, pudding, etc.  Resume your normal diet the day after surgery. Most patients will experience some swelling and bruising in the breast.  Ice packs and a good support bra will help.  Wear the breast binder provided or a sports bra for 72 hours day and night.  After that wear a sports bra during the day until you return to the office. Swelling and bruising can take several days to resolve.  It is common to experience some constipation if taking pain medication after surgery.  Increasing fluid intake and taking a stool softener will usually help or prevent this problem from occurring.  A mild laxative (Milk of Magnesia or Miralax) should be taken according to package directions if there are no bowel movements after 48 hours. I used skin glue on the incision, you may shower in 24 hours.  The glue will flake off over the next 2-3 weeks.  Any sutures or staples will be removed at the office during your follow-up visit. ACTIVITIES:  You may resume regular daily activities (gradually increasing) beginning the next day.  Wearing a good support bra or sports bra minimizes pain and swelling.  You may have  sexual intercourse when it is comfortable. You may drive when you no longer are taking prescription pain medication, you can comfortably wear a seatbelt, and you can safely maneuver your car and apply brakes. RETURN TO WORK:  ______________________________________________________________________________________ You should see your doctor in the office for a follow-up appointment approximately two weeks after your surgery.  Your doctor's nurse will typically make your follow-up appointment when she calls you with your pathology report.  Expect your pathology report 3-4 business days after your surgery.  You may call to check if you do not hear from us after three days. OTHER INSTRUCTIONS: _______________________________________________________________________________________________ _____________________________________________________________________________________________________________________________________ _____________________________________________________________________________________________________________________________________ _____________________________________________________________________________________________________________________________________  WHEN TO CALL DR Dorthy Magnussen: Fever over 101.0 Nausea and/or vomiting. Extreme swelling or bruising. Continued bleeding from incision. Increased pain, redness, or drainage from the incision.  The clinic staff is available to answer your questions during regular business hours.  Please don't hesitate to call and ask to speak to one of the nurses for clinical concerns.  If you have a medical emergency, go to the nearest emergency room or call 911.  A surgeon from [facility] is always on call at the hospital.  For further questions, please visit [email or web address]  

## 2022-12-04 ENCOUNTER — Encounter (HOSPITAL_COMMUNITY): Payer: Self-pay | Admitting: General Surgery

## 2022-12-10 ENCOUNTER — Telehealth: Payer: Self-pay | Admitting: Genetic Counselor

## 2022-12-10 ENCOUNTER — Encounter: Payer: Self-pay | Admitting: Genetic Counselor

## 2022-12-10 ENCOUNTER — Ambulatory Visit: Payer: Self-pay | Admitting: Genetic Counselor

## 2022-12-10 DIAGNOSIS — Z8042 Family history of malignant neoplasm of prostate: Secondary | ICD-10-CM

## 2022-12-10 DIAGNOSIS — Z170 Estrogen receptor positive status [ER+]: Secondary | ICD-10-CM

## 2022-12-10 DIAGNOSIS — Z1379 Encounter for other screening for genetic and chromosomal anomalies: Secondary | ICD-10-CM | POA: Insufficient documentation

## 2022-12-10 DIAGNOSIS — Z853 Personal history of malignant neoplasm of breast: Secondary | ICD-10-CM

## 2022-12-10 LAB — SURGICAL PATHOLOGY

## 2022-12-10 NOTE — Telephone Encounter (Signed)
Contacted patient in attempt to disclose results of genetic testing.  LVM with contact information requesting a call back.  

## 2022-12-10 NOTE — Progress Notes (Signed)
HPI:   [name] was previously seen in the [facility] due to a personal history of breast cancer and concerns regarding a hereditary predisposition to cancer. Please refer to our prior cancer genetics clinic note for more information regarding our discussion, assessment and recommendations, at the time. [name] recent genetic test results were disclosed to her, as were recommendations warranted by these results. These results and recommendations are discussed in more detail below.  CANCER HISTORY:  In 2010, at the age of 67, [name] was diagnosed with right breast cancer s/p lumpectomy, radiation, and anti-estrogens.  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   Oncology History  Malignant neoplasm of upper-outer quadrant of left breast in female, estrogen receptor positive  11/06/2022 Initial Diagnosis   Prior history of right breast cancer 2010 treated with lumpectomy radiation and 5 years of letrozole Screening mammogram detected left breast mass 5 mm 3 o'clock position, ultrasound-guided biopsy revealed grade 2 ILC with LCIS ER 100% PR 100%, Ki-67 10%, HER2 equivalent, FISH negative   11/20/2022 Cancer Staging   Staging form: Breast, AJCC 8th Edition - Clinical stage from 11/20/2022: Stage IA (cT1a, cN0, cM0, G2, ER+, PR+, HER2-) - Signed by Serena Croissant, MD on 11/20/2022 Stage prefix: Initial diagnosis Histologic grading system: 3 grade system   11/29/2022 Genetic Testing   Negative [facility] CustomNext-Cancer +RNAinsight Panel.  Report date is 11/29/2022.   The CustomNext-Cancer+RNAinsight panel offered by [employer] includes sequencing and rearrangement analysis for the following 50 genes: APC, ATM, BAP1, BARD1, BMPR1A, BRCA1, BRCA2, BRIP1, CDH1, CDK4, CDKN2A, CHEK2, DICER1, FH, FLCN, MEN1, MET, MLH1, MSH2, MSH6, MUTYH, NF1, NTHL1, PALB2, PMS2, PTEN, RAD51C, RAD51D, SDHA, SDHB,  SDHC, SDHD, SMAD4, SMARCA4, STK11, TP53, TSC1, TSC2 and VHL (sequencing and deletion/duplication); AXIN2, CTNNA1, HOXB13, KIT, MITF, MSH3, PDGFRA, POLD1 and POLE (sequencing only); EPCAM and GREM1 (deletion/duplication only). RNA data is routinely analyzed for use in variant interpretation for all genes.     FAMILY HISTORY:  We obtained a detailed, 4-generation family history.  Significant diagnoses are listed below:      Family History  Problem Relation Age of Onset   Kidney cancer Father 50   Prostate cancer Maternal Grandfather          dx 54s     [name] is unaware of previous family history of genetic testing for hereditary cancer risks. There is no reported Ashkenazi Jewish ancestry. There is no known consanguinity.  GENETIC TEST RESULTS:  The [facility] CustomNext-Cancer +RNAinsight Panel found no pathogenic mutations.   The CustomNext-Cancer+RNAinsight panel offered by [employer] includes sequencing and rearrangement analysis for the following 50 genes:  APC, ATM, BAP1, BARD1, BMPR1A, BRCA1, BRCA2, BRIP1, CDH1, CDK4, CDKN2A, CHEK2, DICER1, FH, FLCN, MEN1, MET, MLH1, MSH2, MSH6, MUTYH, NF1, NTHL1, PALB2, PMS2, PTEN, RAD51C, RAD51D, SDHA, SDHB, SDHC, SDHD, SMAD4, SMARCA4, STK11, TP53, TSC1, TSC2 and VHL (sequencing and deletion/duplication); AXIN2, CTNNA1, HOXB13, KIT, MITF, MSH3, PDGFRA, POLD1 and POLE (sequencing only); EPCAM and GREM1 (deletion/duplication only). RNA data is routinely analyzed for use in variant interpretation for all genes.  The test report has been scanned into EPIC and is located under the Molecular Pathology section of the Results Review tab.  A portion of the result report is included below for reference. Genetic testing reported out on November 29, 2022.      Even though a pathogenic variant was not identified, possible explanations  for the cancer in the family may include: There may be no hereditary risk for cancer in the family. The cancers in [name]  and/or her family may be sporadic/familial or due to other genetic and environmental factors. There may be a gene mutation in one of these genes that current testing methods cannot detect but that chance is small. There could be another gene that has not yet been discovered, or that we have not yet tested, that is responsible for the cancer diagnoses in the family.    Therefore, it is important to remain in touch with cancer genetics in the future so that we can continue to offer [name] the most up to date genetic testing.    ADDITIONAL GENETIC TESTING:  We discussed with [name] that her genetic testing was fairly extensive.  If there are additional relevant genes identified to increase cancer risk that can be analyzed in the future, we would be happy to discuss and coordinate this testing at that time.     CANCER SCREENING RECOMMENDATIONS:  [name] test result is considered negative (normal).  This means that we have not identified a hereditary cause for her personal history of breast cancer at this time.   An individual's cancer risk and medical management are not determined by genetic test results alone. Overall cancer risk assessment incorporates additional factors, including personal medical history, family history, and any available genetic information that may result in a personalized plan for cancer prevention and surveillance. Therefore, it is recommended she continue to follow the cancer management and screening guidelines provided by her oncology and primary healthcare provider.  RECOMMENDATIONS FOR FAMILY MEMBERS:   Since she did not inherit a identifiable mutation in a cancer predisposition gene included on this panel, her children could not have inherited a known mutation from her in one of these genes. Individuals in this family might be at some increased risk of developing cancer, over the general population risk, due to the family history of cancer.  Individuals in  the family should notify their providers of the family history of cancer. We recommend women in this family have a yearly mammogram beginning at age 78, or 55 years younger than the earliest onset of cancer, an annual clinical breast exam, and perform monthly breast self-exams.    FOLLOW-UP:  Lastly, we discussed with [name] that cancer genetics is a rapidly advancing field and it is possible that new genetic tests will be appropriate for her and/or her family members in the future. We encouraged her to remain in contact with cancer genetics on an annual basis so we can update her personal and family histories and let her know of advances in cancer genetics that may benefit this family.   Our contact number was provided. [name] questions were answered to her satisfaction, and she knows she is welcome to call us at anytime with additional questions or concerns.   Jazzmyne Rasnick M. Rennie Plowman, MS, [facility] Genetic Counselor [email or web address] (P) [phone]

## 2022-12-10 NOTE — Telephone Encounter (Signed)
Disclosed negative genetics.    

## 2022-12-11 ENCOUNTER — Encounter: Payer: Self-pay | Admitting: *Deleted

## 2022-12-11 ENCOUNTER — Telehealth: Payer: Self-pay | Admitting: *Deleted

## 2022-12-11 NOTE — Telephone Encounter (Signed)
Received order for oncotype testing. Requisition faxed to pathology 

## 2022-12-17 ENCOUNTER — Inpatient Hospital Stay (HOSPITAL_BASED_OUTPATIENT_CLINIC_OR_DEPARTMENT_OTHER): Payer: Medicare HMO | Admitting: Hematology and Oncology

## 2022-12-17 ENCOUNTER — Other Ambulatory Visit: Payer: Self-pay

## 2022-12-17 VITALS — BP 137/72 | HR 64 | Temp 97.7°F | Resp 18 | Ht 67.0 in | Wt 185.2 lb

## 2022-12-17 DIAGNOSIS — Z170 Estrogen receptor positive status [ER+]: Secondary | ICD-10-CM

## 2022-12-17 DIAGNOSIS — Z923 Personal history of irradiation: Secondary | ICD-10-CM | POA: Diagnosis not present

## 2022-12-17 DIAGNOSIS — C50412 Malignant neoplasm of upper-outer quadrant of left female breast: Secondary | ICD-10-CM | POA: Diagnosis not present

## 2022-12-17 DIAGNOSIS — Z79811 Long term (current) use of aromatase inhibitors: Secondary | ICD-10-CM | POA: Diagnosis not present

## 2022-12-17 DIAGNOSIS — M8580 Other specified disorders of bone density and structure, unspecified site: Secondary | ICD-10-CM | POA: Diagnosis not present

## 2022-12-17 DIAGNOSIS — E669 Obesity, unspecified: Secondary | ICD-10-CM | POA: Diagnosis not present

## 2022-12-17 DIAGNOSIS — E785 Hyperlipidemia, unspecified: Secondary | ICD-10-CM | POA: Diagnosis not present

## 2022-12-17 DIAGNOSIS — I10 Essential (primary) hypertension: Secondary | ICD-10-CM | POA: Diagnosis not present

## 2022-12-17 DIAGNOSIS — Z7982 Long term (current) use of aspirin: Secondary | ICD-10-CM | POA: Diagnosis not present

## 2022-12-17 NOTE — Assessment & Plan Note (Signed)
11/06/2022:Screening mammogram detected left breast mass 5 mm 3 o'clock position, ultrasound-guided biopsy revealed grade 2 ILC with LCIS ER 100% PR 100%, Ki-67 10%, HER2 equivalent, FISH negative   (Prior history of right breast cancer 2010 treated with lumpectomy radiation and 5 years of letrozole)  12/03/2022:Left lumpectomy: Grade 2 ILC 0.8 cm, margins negative with LCIS, lymphovascular invasion not identified, ER 100%, PR 30%, HER2 negative, Ki-67 10% 0/5 lymph nodes negative  Pathology counseling: I discussed the final pathology report of the patient provided  a copy of this report. I discussed the margins as well as lymph node surgeries. We also discussed the final staging along with previously performed ER/PR and HER-2/neu testing.  Treatment plan: Oncotype DX testing to determine if she would benefit from chemo Adjuvant radiation Adjuvant antiestrogen therapy with anastrozole x 10 years  Return to clinic based upon Oncotype DX test result

## 2022-12-17 NOTE — Progress Notes (Signed)
Patient Care Team: Street, Stephanie Coup, MD as PCP - General (Family Medicine) Serena Croissant, MD as Consulting Physician (Hematology and Oncology) Antony Blackbird, MD as Consulting Physician (Radiation Oncology) Emelia Loron, MD as Consulting Physician (General Surgery) Pershing Proud, RN as Oncology Nurse Navigator Donnelly Angelica, RN as Oncology Nurse Navigator  DIAGNOSIS:  Encounter Diagnosis  Name Primary?   Malignant neoplasm of upper-outer quadrant of left breast in female, estrogen receptor positive (HCC) Yes    SUMMARY OF ONCOLOGIC HISTORY: Oncology History  Malignant neoplasm of upper-outer quadrant of left breast in female, estrogen receptor positive (HCC)  11/06/2022 Initial Diagnosis   Prior history of right breast cancer 2010 treated with lumpectomy radiation and 5 years of letrozole Screening mammogram detected left breast mass 5 mm 3 o'clock position, ultrasound-guided biopsy revealed grade 2 ILC with LCIS ER 100% PR 100%, Ki-67 10%, HER2 equivalent, FISH negative   11/20/2022 Cancer Staging   Staging form: Breast, AJCC 8th Edition - Clinical stage from 11/20/2022: Stage IA (cT1a, cN0, cM0, G2, ER+, PR+, HER2-) - Signed by Serena Croissant, MD on 11/20/2022 Stage prefix: Initial diagnosis Histologic grading system: 3 grade system   11/29/2022 Genetic Testing   Negative [facility] CustomNext-Cancer +RNAinsight Panel.  Report date is 11/29/2022.   The CustomNext-Cancer+RNAinsight panel offered by [employer] includes sequencing and rearrangement analysis for the following 50 genes: APC, ATM, BAP1, BARD1, BMPR1A, BRCA1, BRCA2, BRIP1, CDH1, CDK4, CDKN2A, CHEK2, DICER1, FH, FLCN, MEN1, MET, MLH1, MSH2, MSH6, MUTYH, NF1, NTHL1, PALB2, PMS2, PTEN, RAD51C, RAD51D, SDHA, SDHB, SDHC, SDHD, SMAD4, SMARCA4, STK11, TP53, TSC1, TSC2 and VHL (sequencing and deletion/duplication); AXIN2, CTNNA1, HOXB13, KIT, MITF, MSH3, PDGFRA, POLD1 and POLE (sequencing only); EPCAM and GREM1  (deletion/duplication only). RNA data is routinely analyzed for use in variant interpretation for all genes.   12/03/2022 Surgery   Left lumpectomy: Grade 2 ILC 0.8 cm, margins negative with LCIS, lymphovascular invasion not identified, ER 100%, PR 30%, HER2 negative, Ki-67 10% 0/5 lymph nodes negative     CHIEF COMPLIANT: Follow-up after surgery  INTERVAL HISTORY: [name] is a 67 y.o. female is here because of recent diagnosis of left breast cancer. She presents to the clinic for a follow-up. She reports that she is more uncomfortable but she is not in any pain.    ALLERGIES:  has No Known Allergies.  MEDICATIONS:  Current Outpatient Medications  Medication Sig Dispense Refill   aspirin EC 81 MG tablet Take 1 tablet (81 mg total) by mouth daily. Swallow whole. 90 tablet 3   cetirizine (ZYRTEC) 10 MG tablet Take 10 mg by mouth daily.     traMADol (ULTRAM) 50 MG tablet Take 1 tablet (50 mg total) by mouth every 6 (six) hours as needed. 10 tablet 0   valsartan-hydrochlorothiazide (DIOVAN-HCT) 160-12.5 MG per tablet Take 1 tablet by mouth daily.     alendronate (FOSAMAX) 70 MG tablet Take 70 mg by mouth once a week. Take with a full glass of water on an empty stomach.     No current facility-administered medications for this visit.    PHYSICAL EXAMINATION: ECOG PERFORMANCE STATUS: 0 - Asymptomatic  Vitals:   12/17/22 1408  BP: 137/72  Pulse: 64  Resp: 18  Temp: 97.7 F (36.5 C)  SpO2: 100%   Filed Weights   12/17/22 1408  Weight: 185 lb 3.2 oz (84 kg)      LABORATORY DATA:  I have reviewed the data as listed    Latest Ref Rng &  Units 11/20/2022   12:20 PM 10/24/2021   11:06 AM 12/29/2014   10:26 AM  CMP  Glucose 70 - 99 mg/dL 784  99  90   BUN 8 - 23 mg/dL 28  16  69.6   Creatinine 0.44 - 1.00 mg/dL 2.95  2.84  0.8   Sodium 135 - 145 mmol/L 136  135  140   Potassium 3.5 - 5.1 mmol/L 4.4  4.7  4.2   Chloride 98 - 111 mmol/L 97  95    CO2 22 - 32 mmol/L 33  27   27   Calcium 8.9 - 10.3 mg/dL 9.7  8.9  8.8   Total Protein 6.5 - 8.1 g/dL 7.6   6.6   Total Bilirubin 0.3 - 1.2 mg/dL 0.5   1.32   Alkaline Phos 38 - 126 U/L 133   110   AST 15 - 41 U/L 32   24   ALT 0 - 44 U/L 32   20     Lab Results  Component Value Date   WBC 6.5 11/20/2022   HGB 13.0 11/20/2022   HCT 39.5 11/20/2022   MCV 89.6 11/20/2022   PLT 265 11/20/2022   NEUTROABS 4.0 11/20/2022    ASSESSMENT & PLAN:  Malignant neoplasm of upper-outer quadrant of left breast in female, estrogen receptor positive (HCC) 11/06/2022:Screening mammogram detected left breast mass 5 mm 3 o'clock position, ultrasound-guided biopsy revealed grade 2 ILC with LCIS ER 100% PR 100%, Ki-67 10%, HER2 equivalent, FISH negative   (Prior history of right breast cancer 2010 treated with lumpectomy radiation and 5 years of letrozole)  12/03/2022:Left lumpectomy: Grade 2 ILC 0.8 cm, margins negative with LCIS, lymphovascular invasion not identified, ER 100%, PR 30%, HER2 negative, Ki-67 10% 0/5 lymph nodes negative  Pathology counseling: I discussed the final pathology report of the patient provided  a copy of this report. I discussed the margins as well as lymph node surgeries. We also discussed the final staging along with previously performed ER/PR and HER-2/neu testing.  Treatment plan: Oncotype DX testing to determine if she would benefit from chemo Adjuvant radiation Adjuvant antiestrogen therapy with anastrozole x 10 years  Return to clinic based upon Oncotype DX test result   No orders of the defined types were placed in this encounter.  The patient has a good understanding of the overall plan. she agrees with it. she will call with any problems that may develop before the next visit here. Total time spent: 30 mins including face to face time and time spent for planning, charting and co-ordination of care   Tamsen Meek, MD 12/17/22    I Janan Ridge am acting as a Neurosurgeon for  [employer]  I have reviewed the above documentation for accuracy and completeness, and I agree with the above.

## 2022-12-18 NOTE — Progress Notes (Signed)
Location of Breast Cancer: Malignant neoplasm of upper-outer quadrant of left breast in female, estrogen receptor positive   Histology per Pathology Report:  12-03-22 FINAL MICROSCOPIC DIAGNOSIS:  A. BREAST, LEFT, LUMPECTOMY: - Invasive lobular carcinoma, 0.8 cm, grade 2 - Lobular carcinoma in situ: Not identified - Margins, invasive: All margins negative for invasive carcinoma     Closest, invasive: 0.9 cm - Margins, LCIS: All margins negative for carcinoma in situ     Closest, LCIS: Not applicable - Lymphovascular invasion: Not identified - Prognostic markers:  ER 100%, positive, strong staining intensity, PR 70%, positive, strong staining intensity, Her2 negative - See oncology table  B. BREAST, LEFT ADDITIONAL MEDIAL MARGIN, EXCISION: - Negative for carcinoma in situ or invasive carcinoma  C. BREAST, LEFT ADDITIONAL LATERAL MARGIN, EXCISION: - Negative for carcinoma in situ or invasive carcinoma - One benign lymph node  D. BREAST, LEFT ADDITIONAL POSTERIOR MARGIN, EXCISION: - Negative for carcinoma in situ or invasive carcinoma  E. LYMPH NODE, LEFT AXILLARY, SENTINEL, EXCISION: - Negative for carcinoma  F. LYMPH NODE, LEFT AXILLARY, SENTINEL, EXCISION: - Negative for carcinoma  G. LYMPH NODE, LEFT AXILLARY, SENTINEL, EXCISION: - Negative for carcinoma  H. LYMPH NODE, LEFT AXILLARY, SENTINEL, EXCISION: - Negative for carcinoma  ONCOLOGY TABLE:  INVASIVE CARCINOMA OF THE BREAST:  Resection  Procedure: Lumpectomy Specimen Laterality: Left Histologic Type: Invasive lobular carcinoma Histologic Grade:      Glandular (Acinar)/Tubular Differentiation: 3      Nuclear Pleomorphism: 2      Mitotic Rate: 1      Overall Grade: 2 Tumor Size: 0.8 cm Lobular carcinoma In Situ: Not identified Lymphatic and/or Vascular Invasion: Not identified Treatment Effect in the Breast: No known presurgical therapy Margins: All margins negative for invasive carcinoma      Distance from  Closest Margin (mm): 0.9 cm      Specify Closest Margin (required only if <4mm): Posterior LCIS Margins: Uninvolved by LCIS      Distance from Closest Margin (mm): Not applicable      Specify Closest Margin (required only if <82mm): Not applicable Regional Lymph Nodes:      Number of Lymph Nodes Examined: 5      Number of Sentinel Nodes Examined: 4      Number of Lymph Nodes with Macrometastases (>2 mm): 0      Number of Lymph Nodes with Micrometastases: 0      Number of Lymph Nodes with Isolated Tumor Cells (=0.2 mm or =200 cells): 0      Size of Largest Metastatic Deposit (mm): Not applicable      Extranodal Extension: Not applicable Distant Metastasis:      Distant Site(s) Involved: Not applicable Breast Biomarker Testing Performed on Previous Biopsy:      Testing Performed on Case Number: [id number]            Estrogen Receptor: 100%, positive, strong staining intensity            Progesterone Receptor: 70%, positive, strong staining intensity            HER2: Negative            Ki-67: 10% Pathologic Stage Classification (pTNM, AJCC 8th Edition): pT 1B, pN 0 Representative Tumor Block: A1 Comment(s): Appropriately controlled pancytokeratin stain was performed on all sentinel lymph nodes and does not reveal evidence of carcinoma. (v4.5.0.0)  Receptor Status: ER(100%), PR (70%), Her2-neu (neg), Ki-67(10%)  Did patient present with symptoms (if so, please note  symptoms) or was this found on screening mammography?: screening mammogram  Past/Anticipated interventions by surgeon, if any: 12/03/2022  Preoperative diagnosis:  Clinical stage I left breast cancer Postoperative diagnosis: Same as above Procedure: 1.  Injection of mag trace for sentinel lymph node identification 2.  Left breast radioactive seed guided lumpectomy 3.  Left deep axillary sentinel lymph node biopsy Surgeon: Dr. Harden Mo  Past/Anticipated interventions by medical oncology, if any:  Dr. Pamelia Hoit  on 12-17-22 Oncology History  Malignant neoplasm of upper-outer quadrant of left breast in female, estrogen receptor positive (HCC)  11/06/2022 Initial Diagnosis    Prior history of right breast cancer 2010 treated with lumpectomy radiation and 5 years of letrozole Screening mammogram detected left breast mass 5 mm 3 o'clock position, ultrasound-guided biopsy revealed grade 2 ILC with LCIS ER 100% PR 100%, Ki-67 10%, HER2 equivalent, FISH negative    11/20/2022 Cancer Staging    Staging form: Breast, AJCC 8th Edition - Clinical stage from 11/20/2022: Stage IA (cT1a, cN0, cM0, G2, ER+, PR+, HER2-) - Signed by Serena Croissant, MD on 11/20/2022 Stage prefix: Initial diagnosis Histologic grading system: 3 grade system    11/29/2022 Genetic Testing    Negative [facility] CustomNext-Cancer +RNAinsight Panel.  Report date is 11/29/2022.    The CustomNext-Cancer+RNAinsight panel offered by [employer]es includes sequencing and rearrangement analysis for the following 50 genes: APC, ATM, BAP1, BARD1, BMPR1A, BRCA1, BRCA2, BRIP1, CDH1, CDK4, CDKN2A, CHEK2, DICER1, FH, FLCN, MEN1, MET, MLH1, MSH2, MSH6, MUTYH, NF1, NTHL1, PALB2, PMS2, PTEN, RAD51C, RAD51D, SDHA, SDHB, SDHC, SDHD, SMAD4, SMARCA4, STK11, TP53, TSC1, TSC2 and VHL (sequencing and deletion/duplication); AXIN2, CTNNA1, HOXB13, KIT, MITF, MSH3, PDGFRA, POLD1 and POLE (sequencing only); EPCAM and GREM1 (deletion/duplication only). RNA data is routinely analyzed for use in variant interpretation for all genes.    12/03/2022 Surgery    Left lumpectomy: Grade 2 ILC 0.8 cm, margins negative with LCIS, lymphovascular invasion not identified, ER 100%, PR 30%, HER2 negative, Ki-67 10% 0/5 lymph nodes negative   ASSESSMENT & PLAN:  Malignant neoplasm of upper-outer quadrant of left breast in female, estrogen receptor positive (HCC) 11/06/2022:Screening mammogram detected left breast mass 5 mm 3 o'clock position, ultrasound-guided biopsy revealed grade 2 ILC with LCIS ER  100% PR 100%, Ki-67 10%, HER2 equivalent, FISH negative   (Prior history of right breast cancer 2010 treated with lumpectomy radiation and 5 years of letrozole)   12/03/2022:Left lumpectomy: Grade 2 ILC 0.8 cm, margins negative with LCIS, lymphovascular invasion not identified, ER 100%, PR 30%, HER2 negative, Ki-67 10% 0/5 lymph nodes negative   Pathology counseling: I discussed the final pathology report of the patient provided  a copy of this report. I discussed the margins as well as lymph node surgeries. We also discussed the final staging along with previously performed ER/PR and HER-2/neu testing.   Treatment plan: Oncotype DX testing to determine if she would benefit from chemo Adjuvant radiation Adjuvant antiestrogen therapy with anastrozole x 10 years   Return to clinic based upon Oncotype DX test result  Lymphedema issues, if any:  no concerns   Pain issues, if any:  none to report  SAFETY ISSUES: Prior radiation? Yes, Oct of 2010 for breast cancer Pacemaker/ICD? no Possible current pregnancy?no Is the patient on methotrexate? no  Current Complaints / other details:  Did well with radiation in past. Overall wants to know options for treatment. Concerned she has not heard from Dr. Pamelia Hoit on oncotype.

## 2022-12-20 DIAGNOSIS — C50412 Malignant neoplasm of upper-outer quadrant of left female breast: Secondary | ICD-10-CM | POA: Diagnosis not present

## 2022-12-20 DIAGNOSIS — Z170 Estrogen receptor positive status [ER+]: Secondary | ICD-10-CM | POA: Diagnosis not present

## 2022-12-23 NOTE — Therapy (Signed)
OUTPATIENT PHYSICAL THERAPY BREAST CANCER POST OP FOLLOW UP   Patient Name: [name] MRN: [id number] DOB:Jul 27, 1956, 67 y.o., female Today's Date: 12/24/2022  END OF SESSION:  PT End of Session - 12/24/22 0917     Visit Number 2    Number of Visits 2    Date for PT Re-Evaluation 01/09/23    PT Start Time 0900    PT Stop Time 0918    PT Time Calculation (min) 18 min    Activity Tolerance Patient tolerated treatment well    Behavior During Therapy [facility] for tasks assessed/performed             Past Medical History:  Diagnosis Date   Breast cancer (HCC)    Breast cancer, right breast (HCC) 12/31/2012   Chronic allergic rhinitis due to pollen    Essential hypertension 10/08/2016   Hyperlipidemia    Obesity    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/2016   Past Surgical History:  Procedure Laterality Date   AXILLARY SENTINEL NODE BIOPSY Left 12/03/2022   Procedure: LEFT AXILLARY SENTINEL NODE BIOPSY;  Surgeon: Emelia Loron, MD;  Location: [facility] OR;  Service: General;  Laterality: Left;   BREAST LUMPECTOMY  08/19/2008   right   BREAST LUMPECTOMY WITH RADIOACTIVE SEED AND SENTINEL LYMPH NODE BIOPSY Left 12/03/2022   Procedure: LEFT BREAST LUMPECTOMY WITH RADIOACTIVE SEED;  Surgeon: Emelia Loron, MD;  Location: [facility] OR;  Service: General;  Laterality: Left;   CHOLECYSTECTOMY  08/20/2003   COLONOSCOPY  2015   TUBAL LIGATION     Patient Active Problem List   Diagnosis Date Noted   Genetic testing 12/10/2022   Malignant neoplasm of upper-outer quadrant of left breast in female, estrogen receptor positive (HCC) 11/18/2022   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  Casper Harrison   REFERRING PROVIDER: Dr. Pamelia Hoit            REFERRING DIAG: Left breast cancer   THERAPY DIAG:  Malignant neoplasm of upper-outer quadrant of left breast in female, estrogen receptor positive [facility])  Aftercare following surgery for neoplasm  At risk for lymphedema  Rationale for Evaluation and Treatment: Rehabilitation  ONSET DATE: 11/18/22  SUBJECTIVE:  SUBJECTIVE STATEMENT: I am doing really well.   PERTINENT HISTORY:  Hx of Rt breast cancer with lumpectomy and radiation in 2010 with 6 negative nodes removed. Now with Lt breast cancer ILC grade 2 ER/PR positive Ki67% 10% with Lt lumpectomy on 12/03/22 with 5 negative nodes removed.  Hx of frozen shoulder bil.  Oncotype testing pending. Radiation for sure.    PATIENT GOALS:  Reassess how my recovery is going related to arm function, pain, and swelling.  PAIN:  Are you having pain? No  PRECAUTIONS: Recent Surgery,  UE Lymphedema risk,   ACTIVITY LEVEL / LEISURE: back to normal levels   OBJECTIVE:   PATIENT SURVEYS:  QUICK DASH: 6%  OBSERVATIONS: Incisions are healing well with slight puckering at both - educated on scar massage.  Not wearing compression and breast does not feel swollen or heavy  POSTURE:  WNL  LYMPHEDEMA ASSESSMENT:  UPPER EXTREMITY AROM/PROM:   A/PROM RIGHT   eval     Shoulder extension 60   Shoulder flexion 146   Shoulder abduction 165   Shoulder internal rotation     Shoulder external rotation 90                           (Blank rows = not tested)   A/PROM LEFT   eval 12/24/22  Shoulder extension 60   Shoulder flexion 160 160  Shoulder abduction 165 160 - pull  Shoulder internal rotation     Shoulder external rotation 90 90                          (Blank rows = not tested)   UPPER EXTREMITY  STRENGTH: 5/5 no pain    LYMPHEDEMA ASSESSMENTS:    LANDMARK RIGHT   eval 12/24/22  10 cm proximal to olecranon process 34.2 35  Olecranon process 26.7 26.7  10 cm proximal to ulnar styloid process 23.5 23.5  Just proximal to ulnar styloid process 16.5 16.5  Across hand at thumb web space 19.3 19.3  At base of 2nd digit 7.0 7.0  (Blank rows = not tested)   LANDMARK LEFT   eval 12/24/22  10 cm proximal to olecranon process 34 34  Olecranon process 27 27  10  cm proximal to ulnar styloid process 23.3 24  Just proximal to ulnar styloid process 16 16  Across hand at thumb web space 19 19  At base of 2nd digit 6.5 6.5  (Blank rows = not tested)   PATIENT EDUCATION:  Education details: post op section per below Person educated: Patient Education method: Medical illustrator Education comprehension: verbalized understanding  HOME EXERCISE PROGRAM: Reviewed previously given post op HEP. Advanced to supine flexion and chest stretch  ASSESSMENT:  CLINICAL IMPRESSION: Pt is doing very well with some mild incision puckering and pull with end range abduction which should improve with continued stretching and added scar massage.   Pt will benefit from skilled therapeutic intervention to improve on the following deficits: Decreased knowledge of precautions, impaired UE functional use, pain, decreased ROM, postural dysfunction.   PT treatment/interventions: ADL/Self care home management, Patient/Family education, Self Care, and Re-evaluation   GOALS: Goals reviewed with patient? Yes  LONG TERM GOALS:  (STG=LTG)  GOALS Name Target Date  Goal status  1 Pt will demonstrate she has regained full shoulder ROM and function post operatively compared to baselines.  Baseline: 12/24/22 MOSTLY MET  PLAN:  PT FREQUENCY/DURATION: SOZO only  PLAN FOR NEXT SESSION: SOZO only   [facility]  [address], [address]  [place]  [address]  [phone]  After Breast Cancer Class It is recommended you attend the ABC class to be educated on lymphedema risk reduction. This class is free of charge and lasts for 1 hour. It is a 1-time class. You will need to download the TEAMS app either on your phone or computer. We will send you a link the night before or the morning of the class. You should be able to click on that link to join the class. This is not a confidential class. You don't have to turn your camera on, but other participants may be able to see your email address.  Scar massage You can begin gentle scar massage to you incision sites. Gently place one hand on the incision and move the skin (without sliding on the skin) in various directions. Do this for a few minutes and then you can gently massage either coconut oil or vitamin E cream into the scars.  Compression garment You should continue wearing your compression bra until you feel like you no longer have swelling.  Home exercise Program Continue doing the exercises you were given until you feel like you can do them without feeling any tightness at the end.   Walking Program Studies show that 30 minutes of walking per day (fast enough to elevate your heart rate) can significantly reduce the risk of a cancer recurrence. If you can't walk due to other medical reasons, we encourage you to find another activity you could do (like a stationary bike or water exercise).  Posture After breast cancer surgery, people frequently sit with rounded shoulders posture because it puts their incisions on slack and feels better. If you sit like this and scar tissue forms in that position, you can become very tight and have pain sitting or standing with good posture. Try to be aware of your posture and sit and stand up tall to heal properly.  Follow up PT: It is recommended you return every 3 months for the first 3 years following surgery to be assessed on the SOZO machine for an  L-Dex score. This helps prevent clinically significant lymphedema in 95% of patients. These follow up screens are 10 minute appointments that you are not billed for.  Idamae Lusher, PT 12/24/2022, 9:18 AM  PHYSICAL THERAPY DISCHARGE SUMMARY  Visits from Start of Care: 2  Current functional level related to goals / functional outcomes: Ready for lymphedema surveillance only   Remaining deficits: Lymphedema risk   Education / Equipment: HEP  Plan: Patient agrees to discharge.   Patient is being discharged due to meeting the stated rehab goals.

## 2022-12-24 ENCOUNTER — Ambulatory Visit: Payer: Medicare HMO | Attending: Hematology and Oncology | Admitting: Rehabilitation

## 2022-12-24 ENCOUNTER — Encounter: Payer: Self-pay | Admitting: Rehabilitation

## 2022-12-24 DIAGNOSIS — Z9189 Other specified personal risk factors, not elsewhere classified: Secondary | ICD-10-CM | POA: Diagnosis not present

## 2022-12-24 DIAGNOSIS — C50412 Malignant neoplasm of upper-outer quadrant of left female breast: Secondary | ICD-10-CM | POA: Diagnosis not present

## 2022-12-24 DIAGNOSIS — Z170 Estrogen receptor positive status [ER+]: Secondary | ICD-10-CM | POA: Diagnosis not present

## 2022-12-24 DIAGNOSIS — Z483 Aftercare following surgery for neoplasm: Secondary | ICD-10-CM | POA: Diagnosis not present

## 2022-12-25 ENCOUNTER — Encounter: Payer: Self-pay | Admitting: Hematology and Oncology

## 2022-12-25 ENCOUNTER — Ambulatory Visit: Payer: BC Managed Care – PPO | Attending: Cardiology | Admitting: Cardiology

## 2022-12-25 ENCOUNTER — Encounter: Payer: Self-pay | Admitting: Cardiology

## 2022-12-25 VITALS — BP 120/80 | HR 60 | Ht 67.0 in | Wt 179.0 lb

## 2022-12-25 DIAGNOSIS — C50412 Malignant neoplasm of upper-outer quadrant of left female breast: Secondary | ICD-10-CM

## 2022-12-25 DIAGNOSIS — I2510 Atherosclerotic heart disease of native coronary artery without angina pectoris: Secondary | ICD-10-CM | POA: Diagnosis not present

## 2022-12-25 DIAGNOSIS — I10 Essential (primary) hypertension: Secondary | ICD-10-CM

## 2022-12-25 DIAGNOSIS — Z170 Estrogen receptor positive status [ER+]: Secondary | ICD-10-CM

## 2022-12-25 NOTE — Patient Instructions (Signed)
Medication Instructions:  Your physician recommends that you continue on your current medications as directed. Please refer to the Current Medication list given to you today.  *If you need a refill on your cardiac medications before your next appointment, please call your pharmacy*   Lab Work: None If you have labs (blood work) drawn today and your tests are completely normal, you will receive your results only by: MyChart Message (if you have MyChart) OR A paper copy in the mail If you have any lab test that is abnormal or we need to change your treatment, we will call you to review the results.   Testing/Procedures: None   Follow-Up: At [facility], you and your health needs are our priority.  As part of our continuing mission to provide you with exceptional heart care, we have created designated Provider Care Teams.  These Care Teams include your primary Cardiologist (physician) and Advanced Practice Providers (APPs -  Physician Assistants and Nurse Practitioners) who all work together to provide you with the care you need, when you need it.  We recommend signing up for the patient portal called "MyChart".  Sign up information is provided on this After Visit Summary.  MyChart is used to connect with patients for Virtual Visits (Telemedicine).  Patients are able to view lab/test results, encounter notes, upcoming appointments, etc.  Non-urgent messages can be sent to your provider as well.   To learn more about what you can do with MyChart, go to [email or web address].mychart.com.    Your next appointment:   1 year(s)  Provider:   Robert Krasowski, MD    Other Instructions None  

## 2022-12-25 NOTE — Progress Notes (Signed)
Cardiology Office Note:    Date:  12/25/2022   ID:  KADISON NISHIMURA, DOB 23-Jul-1956, MRN [id number]  PCP:  Street, Stephanie Coup, MD  Cardiologist:  Gypsy Balsam, MD    Referring MD: Street, Stephanie Coup, *   Chief Complaint  Patient presents with   Follow-up  Doing well cardiac wise  History of Present Illness:    [name] is a 67 y.o. female with past medical history significant for dyslipidemia, essential hypertension, she was sent to [place] because of atypical chest pain, coronary CT angio has been done which showed 25 to 49% stenosis of mid LAD, 25 to 49% stenosis obtuse marginal branch calcium score 84.6.  She started with aspirin and risk factors modifications.  Comes today to my office for follow-up overall doing well cardiac wise but she did develop new breast cancer that required lumpectomy and now she is being evaluated for possible radiation therapy and chemotherapy cardiac wise doing well.  She lost some weight after changing her diet and she is thinking about going back to her exercise routine which I strongly encouraged  Past Medical History:  Diagnosis Date   Breast cancer (HCC)    Breast cancer, right breast (HCC) 12/31/2012   Chronic allergic rhinitis due to pollen    Essential hypertension 10/08/2016   History of breast cancer in female 10/08/2016   Hyperlipidemia    Obesity    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/2016    Past Surgical History:  Procedure Laterality Date   AXILLARY SENTINEL NODE BIOPSY Left 12/03/2022   Procedure: LEFT AXILLARY SENTINEL NODE BIOPSY;  Surgeon: Emelia Loron, MD;  Location: [facility] OR;  Service: General;  Laterality: Left;   BREAST LUMPECTOMY  08/19/2008   right   BREAST LUMPECTOMY WITH RADIOACTIVE SEED AND SENTINEL LYMPH NODE BIOPSY Left 12/03/2022   Procedure: LEFT BREAST LUMPECTOMY WITH RADIOACTIVE SEED;  Surgeon: Emelia Loron, MD;  Location: [facility] OR;  Service: General;  Laterality: Left;   CHOLECYSTECTOMY  08/20/2003    COLONOSCOPY  2015   TUBAL LIGATION      Current Medications: Current Meds  Medication Sig   aspirin EC 81 MG tablet Take 1 tablet (81 mg total) by mouth daily. Swallow whole.   cetirizine (ZYRTEC) 10 MG tablet Take 10 mg by mouth daily as needed for allergies.   valsartan-hydrochlorothiazide (DIOVAN-HCT) 160-12.5 MG per tablet Take 1 tablet by mouth daily.     Allergies:   Patient has no known allergies.   Social History   Socioeconomic History   Marital status: Widowed    Spouse name: Not on file   Number of children: Not on file   Years of education: Not on file   Highest education level: Not on file  Occupational History   Not on file  Tobacco Use   Smoking status: Former    Types: Cigarettes   Smokeless tobacco: Never   Tobacco comments:    Quit over 25 years ago  Substance and Sexual Activity   Alcohol use: Yes    Comment: Occasional   Drug use: No   Sexual activity: Yes    Birth control/protection: Post-menopausal, Surgical  Other Topics Concern   Not on file  Social History Narrative   Not on file   Social Determinants of Health   Financial Resource Strain: Not on file  Food Insecurity: Not on file  Transportation Needs: Not on file  Physical Activity: Not on file  Stress: Not on file  Social Connections: Not  on file     Family History: The patient's family history includes Diabetes in her father and mother; Heart disease in her father; Hyperlipidemia in her mother; Hypertension in her mother; Kidney cancer (age of onset: 72) in her father; Prostate cancer in her maternal grandfather. ROS:   Please see the history of present illness.    All 14 point review of systems negative except as described per history of present illness  EKGs/Labs/Other Studies Reviewed:      Recent Labs: 11/20/2022: ALT 32; BUN 28; Creatinine 0.86; Hemoglobin 13.0; Platelet Count 265; Potassium 4.4; Sodium 136  Recent Lipid Panel    Component Value Date/Time   LDLDIRECT  105 (H) 10/17/2021 1641    Physical Exam:    VS:  BP 120/80 (BP Location: Right Arm, Patient Position: Sitting, Cuff Size: Normal)   Pulse 60   Ht 5\' 7"  (1.702 m)   Wt 179 lb (81.2 kg)   SpO2 96%   BMI 28.04 kg/m     Wt Readings from Last 3 Encounters:  12/25/22 179 lb (81.2 kg)  12/17/22 185 lb 3.2 oz (84 kg)  12/03/22 180 lb (81.6 kg)     GEN:  Well nourished, well developed in no acute distress HEENT: Normal NECK: No JVD; No carotid bruits LYMPHATICS: No lymphadenopathy CARDIAC: RRR, no murmurs, no rubs, no gallops RESPIRATORY:  Clear to auscultation without rales, wheezing or rhonchi  ABDOMEN: Soft, non-tender, non-distended MUSCULOSKELETAL:  No edema; No deformity  SKIN: Warm and dry LOWER EXTREMITIES: no swelling NEUROLOGIC:  Alert and oriented x 3 PSYCHIATRIC:  Normal affect   ASSESSMENT:    1. Coronary artery disease involving native coronary artery of native heart without angina pectoris   2. Essential hypertension   3. Malignant neoplasm of upper-outer quadrant of left breast in female, estrogen receptor positive (HCC)    PLAN:    In order of problems listed above:  Coronary disease stable from that point review.  Asymptomatic continue risk factors modifications. Essential hypertension blood pressure well-controlled continue present management. Dyslipidemia she need to be on cholesterol medication I will call primary care physician to get fasting lipid profile and then decide about choice of medication. Breast cancer status post recent lumpectomy.  Recovering awaiting results of analysis by pathologist to decide about potentially chemotherapy and radiation   Medication Adjustments/Labs and Tests Ordered: Current medicines are reviewed at length with the patient today.  Concerns regarding medicines are outlined above.  No orders of the defined types were placed in this encounter.  Medication changes: No orders of the defined types were placed in this  encounter.   Signed, Georgeanna Lea, MD, [facility] 12/25/2022 10:49 AM    [facility]

## 2022-12-25 NOTE — Addendum Note (Signed)
Addended by: Roosvelt Harps R on: 12/25/2022 01:09 PM   Modules accepted: Orders

## 2022-12-27 ENCOUNTER — Encounter: Payer: Self-pay | Admitting: *Deleted

## 2022-12-30 DIAGNOSIS — H5203 Hypermetropia, bilateral: Secondary | ICD-10-CM | POA: Diagnosis not present

## 2022-12-30 DIAGNOSIS — H524 Presbyopia: Secondary | ICD-10-CM | POA: Diagnosis not present

## 2022-12-30 DIAGNOSIS — H52223 Regular astigmatism, bilateral: Secondary | ICD-10-CM | POA: Diagnosis not present

## 2022-12-30 DIAGNOSIS — H40033 Anatomical narrow angle, bilateral: Secondary | ICD-10-CM | POA: Diagnosis not present

## 2022-12-31 ENCOUNTER — Encounter: Payer: Self-pay | Admitting: Radiation Oncology

## 2022-12-31 ENCOUNTER — Telehealth: Payer: Self-pay

## 2022-12-31 NOTE — Progress Notes (Signed)
Radiation Oncology         [phone] ________________________________  Name: [name] MRN: [id number]  Date: 01/01/2023  DOB: Dec 20, 1955  Re-Evaluation Note  CC: Street, Stephanie Coup, MD  Serena Croissant, MD  No diagnosis found.  Diagnosis:  Stage IA (cT1a, cN0, cM0) Left Breast UOQ, Invasive Lobular Carcinoma, ER+ / PR+ / Her2-, Grade 2: s/p lumpectomy and SLN evaluation   Narrative:  The patient returns today to discuss radiation treatment options. She was seen in the multidisciplinary breast clinic on 11/20/22.   She underwent genetic testing on her consultation date. Results showed no clinically significant variants detected by BRCAplus or +RNAinsight testing.  She opted to proceed with a left breast lumpectomy with SLN biopsies on 12/03/22 under the care of Dr. Dwain Sarna. Pathology from the procedure revealed: tumor the size of 8 mm; histology of grade 2 invasive lobular carcinoma (without LCIS); all margins negative for invasive carcinoma; margin status to invasive disease of 9 mm from the closest margin; nodal status of 4/4 left axillary sentinel lymph node excisions negative for carcinoma, and 1/1 left axillary lymph node excision negative for carcinoma. Prognostic indicators significant for: estrogen receptor 100% positive and progesterone receptor 70% positive, both with strong staining intensity; Proliferation marker Ki67 at 10%; Her2 status negative; Grade 2.   Oncotype DX was obtained on the final surgical sample and the recurrence score of 17 predicts a risk of recurrence outside the breast over the next 9 years of 5%, if the patient's only systemic therapy were to be an antiestrogen for 5 years.  It also predicts no significant benefit from chemotherapy.  The patient has met with Dr. Pamelia Hoit and will proceed with antiestrogen therapy consisting of anastrozole x 10 years. As noted above, the patient will not require adjuvant chemotherapy based on Oncotype Dx.   On review  of systems, the patient reports ***. She denies *** and any other symptoms.    Allergies:  has No Known Allergies.  Meds: Current Outpatient Medications  Medication Sig Dispense Refill   aspirin EC 81 MG tablet Take 1 tablet (81 mg total) by mouth daily. Swallow whole. 90 tablet 3   cetirizine (ZYRTEC) 10 MG tablet Take 10 mg by mouth daily as needed for allergies.     valsartan-hydrochlorothiazide (DIOVAN-HCT) 160-12.5 MG per tablet Take 1 tablet by mouth daily.     No current facility-administered medications for this encounter.    Physical Findings: The patient is in no acute distress. Patient is alert and oriented.  vitals were not taken for this visit.  No significant changes. Lungs are clear to auscultation bilaterally. Heart has regular rate and rhythm. No palpable cervical, supraclavicular, or axillary adenopathy. Abdomen soft, non-tender, normal bowel sounds. Right Breast: no palpable mass, nipple discharge or bleeding. Left Breast: ***  Lab Findings: Lab Results  Component Value Date   WBC 6.5 11/20/2022   HGB 13.0 11/20/2022   HCT 39.5 11/20/2022   MCV 89.6 11/20/2022   PLT 265 11/20/2022    Radiographic Findings: No results found.  Impression: Stage IA (cT1a, cN0, cM0) Left Breast UOQ, Invasive Lobular Carcinoma, ER+ / PR+ / Her2-, Grade 2: s/p lumpectomy and SLN evaluation   ***  Plan:  Patient is scheduled for CT simulation {date/later today}. ***  -----------------------------------  Billie Lade, PhD, MD  This document serves as a record of services personally performed by Antony Blackbird, MD. It was created on his behalf by Neena Rhymes, a trained medical scribe. The  creation of this record is based on the scribe's personal observations and the provider's statements to them. This document has been checked and approved by the attending provider.

## 2022-12-31 NOTE — Telephone Encounter (Signed)
Rn called to obtain meaningful use and nurse evaluation information. Consult note complete and routed to Dr. Roselind Messier for review. She is doing well overall with no major concerns.

## 2023-01-01 ENCOUNTER — Ambulatory Visit
Admission: RE | Admit: 2023-01-01 | Discharge: 2023-01-01 | Disposition: A | Discharge status: Home / Self Care | Payer: Medicare HMO | Source: Ambulatory Visit | Attending: Radiation Oncology | Admitting: Radiation Oncology

## 2023-01-01 ENCOUNTER — Other Ambulatory Visit: Payer: Self-pay

## 2023-01-01 VITALS — BP 159/93 | HR 61 | Temp 97.0°F | Resp 18 | Ht 67.0 in | Wt 180.1 lb

## 2023-01-01 DIAGNOSIS — Z510 Encounter for antineoplastic radiation therapy: Secondary | ICD-10-CM | POA: Insufficient documentation

## 2023-01-01 DIAGNOSIS — Z79899 Other long term (current) drug therapy: Secondary | ICD-10-CM | POA: Diagnosis not present

## 2023-01-01 DIAGNOSIS — Z170 Estrogen receptor positive status [ER+]: Secondary | ICD-10-CM | POA: Insufficient documentation

## 2023-01-01 DIAGNOSIS — Z7982 Long term (current) use of aspirin: Secondary | ICD-10-CM | POA: Diagnosis not present

## 2023-01-01 DIAGNOSIS — C50412 Malignant neoplasm of upper-outer quadrant of left female breast: Secondary | ICD-10-CM | POA: Insufficient documentation

## 2023-01-02 ENCOUNTER — Encounter: Payer: Self-pay | Admitting: *Deleted

## 2023-01-02 ENCOUNTER — Telehealth: Payer: Self-pay | Admitting: *Deleted

## 2023-01-02 DIAGNOSIS — C50412 Malignant neoplasm of upper-outer quadrant of left female breast: Secondary | ICD-10-CM

## 2023-01-02 NOTE — Telephone Encounter (Signed)
Received oncotype results of 17/5%. Patient aware.

## 2023-01-03 ENCOUNTER — Telehealth: Payer: Self-pay | Admitting: Hematology and Oncology

## 2023-01-03 NOTE — Telephone Encounter (Signed)
Left patient a vm regarding upcoming appointment  

## 2023-01-07 DIAGNOSIS — Z170 Estrogen receptor positive status [ER+]: Secondary | ICD-10-CM | POA: Diagnosis not present

## 2023-01-07 DIAGNOSIS — C50412 Malignant neoplasm of upper-outer quadrant of left female breast: Secondary | ICD-10-CM | POA: Diagnosis not present

## 2023-01-07 DIAGNOSIS — Z510 Encounter for antineoplastic radiation therapy: Secondary | ICD-10-CM | POA: Diagnosis not present

## 2023-01-08 ENCOUNTER — Other Ambulatory Visit: Payer: Self-pay

## 2023-01-08 ENCOUNTER — Ambulatory Visit
Admission: RE | Admit: 2023-01-08 | Discharge: 2023-01-08 | Disposition: A | Discharge status: Home / Self Care | Payer: Medicare HMO | Source: Ambulatory Visit | Attending: Radiation Oncology | Admitting: Radiation Oncology

## 2023-01-08 DIAGNOSIS — Z170 Estrogen receptor positive status [ER+]: Secondary | ICD-10-CM

## 2023-01-08 DIAGNOSIS — Z510 Encounter for antineoplastic radiation therapy: Secondary | ICD-10-CM | POA: Diagnosis not present

## 2023-01-08 DIAGNOSIS — C50412 Malignant neoplasm of upper-outer quadrant of left female breast: Secondary | ICD-10-CM | POA: Diagnosis not present

## 2023-01-08 LAB — RAD ONC ARIA SESSION SUMMARY
Course Elapsed Days: 0
Plan Fractions Treated to Date: 1
Plan Prescribed Dose Per Fraction: 2.67 Gy
Plan Total Fractions Prescribed: 15
Plan Total Prescribed Dose: 40.05 Gy
Reference Point Dosage Given to Date: 2.67 Gy
Reference Point Session Dosage Given: 2.67 Gy
Session Number: 1

## 2023-01-09 ENCOUNTER — Other Ambulatory Visit: Payer: Self-pay

## 2023-01-09 ENCOUNTER — Ambulatory Visit
Admission: RE | Admit: 2023-01-09 | Discharge: 2023-01-09 | Disposition: A | Discharge status: Home / Self Care | Payer: Medicare HMO | Source: Ambulatory Visit | Attending: Radiation Oncology | Admitting: Radiation Oncology

## 2023-01-09 DIAGNOSIS — C50412 Malignant neoplasm of upper-outer quadrant of left female breast: Secondary | ICD-10-CM | POA: Diagnosis not present

## 2023-01-09 DIAGNOSIS — Z170 Estrogen receptor positive status [ER+]: Secondary | ICD-10-CM | POA: Diagnosis not present

## 2023-01-09 DIAGNOSIS — Z510 Encounter for antineoplastic radiation therapy: Secondary | ICD-10-CM | POA: Diagnosis not present

## 2023-01-09 LAB — RAD ONC ARIA SESSION SUMMARY
Course Elapsed Days: 1
Plan Fractions Treated to Date: 2
Plan Prescribed Dose Per Fraction: 2.67 Gy
Plan Total Fractions Prescribed: 15
Plan Total Prescribed Dose: 40.05 Gy
Reference Point Dosage Given to Date: 5.34 Gy
Reference Point Session Dosage Given: 2.67 Gy
Session Number: 2

## 2023-01-10 ENCOUNTER — Ambulatory Visit
Admission: RE | Admit: 2023-01-10 | Discharge: 2023-01-10 | Disposition: A | Discharge status: Home / Self Care | Payer: Medicare HMO | Source: Ambulatory Visit | Attending: Radiation Oncology | Admitting: Radiation Oncology

## 2023-01-10 ENCOUNTER — Other Ambulatory Visit: Payer: Self-pay

## 2023-01-10 DIAGNOSIS — Z510 Encounter for antineoplastic radiation therapy: Secondary | ICD-10-CM | POA: Diagnosis not present

## 2023-01-10 DIAGNOSIS — C50412 Malignant neoplasm of upper-outer quadrant of left female breast: Secondary | ICD-10-CM | POA: Diagnosis not present

## 2023-01-10 LAB — RAD ONC ARIA SESSION SUMMARY
Course Elapsed Days: 2
Plan Fractions Treated to Date: 3
Plan Prescribed Dose Per Fraction: 2.67 Gy
Plan Total Fractions Prescribed: 15
Plan Total Prescribed Dose: 40.05 Gy
Reference Point Dosage Given to Date: 8.01 Gy
Reference Point Session Dosage Given: 2.67 Gy
Session Number: 3

## 2023-01-14 ENCOUNTER — Ambulatory Visit
Admission: RE | Admit: 2023-01-14 | Discharge: 2023-01-14 | Disposition: A | Discharge status: Home / Self Care | Payer: Medicare HMO | Source: Ambulatory Visit | Attending: Radiation Oncology | Admitting: Radiation Oncology

## 2023-01-14 ENCOUNTER — Other Ambulatory Visit: Payer: Self-pay

## 2023-01-14 DIAGNOSIS — Z510 Encounter for antineoplastic radiation therapy: Secondary | ICD-10-CM | POA: Diagnosis not present

## 2023-01-14 DIAGNOSIS — C50412 Malignant neoplasm of upper-outer quadrant of left female breast: Secondary | ICD-10-CM | POA: Diagnosis not present

## 2023-01-14 DIAGNOSIS — Z170 Estrogen receptor positive status [ER+]: Secondary | ICD-10-CM | POA: Diagnosis not present

## 2023-01-14 LAB — RAD ONC ARIA SESSION SUMMARY
Course Elapsed Days: 6
Plan Fractions Treated to Date: 4
Plan Prescribed Dose Per Fraction: 2.67 Gy
Plan Total Fractions Prescribed: 15
Plan Total Prescribed Dose: 40.05 Gy
Reference Point Dosage Given to Date: 10.68 Gy
Reference Point Session Dosage Given: 2.67 Gy
Session Number: 4

## 2023-01-14 MED ORDER — RADIAPLEXRX EX GEL: Freq: Once | CUTANEOUS | Status: AC

## 2023-01-14 MED ORDER — ALRA NON-METALLIC DEODORANT (RAD-ONC)
1.0000 | Freq: Once | TOPICAL | Status: AC
  Administered 2023-01-14: 1 via TOPICAL

## 2023-01-14 NOTE — Progress Notes (Signed)
Pt here for patient teaching. Pt given Radiation and You booklet, skin care instructions, Alra deodorant, and Radiaplex gel. Reviewed areas of pertinence such as fatigue, hair loss, mouth changes, nausea and vomiting, skin changes, breast tenderness, breast swelling, cough, shortness of breath, earaches, and taste changes. Pt able to give teach back of to pat skin, use unscented/gentle soap, and drink plenty of water, apply Radiaplex bid, avoid applying anything to skin within 4 hours of treatment, avoid wearing an under wire bra, and to use an electric razor if they must shave. Pt verbalizes understanding of information given and will contact nursing with any questions or concerns.    Http://rtanswers.org/treatmentinformation/whattoexpect/index  This concludes the interview.   Ruel Favors, LPN

## 2023-01-15 ENCOUNTER — Ambulatory Visit
Admission: RE | Admit: 2023-01-15 | Discharge: 2023-01-15 | Disposition: A | Discharge status: Home / Self Care | Payer: Medicare HMO | Source: Ambulatory Visit | Attending: Radiation Oncology | Admitting: Radiation Oncology

## 2023-01-15 ENCOUNTER — Other Ambulatory Visit: Payer: Self-pay

## 2023-01-15 DIAGNOSIS — C50412 Malignant neoplasm of upper-outer quadrant of left female breast: Secondary | ICD-10-CM | POA: Diagnosis not present

## 2023-01-15 DIAGNOSIS — Z510 Encounter for antineoplastic radiation therapy: Secondary | ICD-10-CM | POA: Diagnosis not present

## 2023-01-15 LAB — RAD ONC ARIA SESSION SUMMARY
Course Elapsed Days: 7
Plan Fractions Treated to Date: 5
Plan Prescribed Dose Per Fraction: 2.67 Gy
Plan Total Fractions Prescribed: 15
Plan Total Prescribed Dose: 40.05 Gy
Reference Point Dosage Given to Date: 13.35 Gy
Reference Point Session Dosage Given: 2.67 Gy
Session Number: 5

## 2023-01-16 ENCOUNTER — Ambulatory Visit
Admission: RE | Admit: 2023-01-16 | Discharge: 2023-01-16 | Disposition: A | Discharge status: Home / Self Care | Payer: Medicare HMO | Source: Ambulatory Visit | Attending: Radiation Oncology | Admitting: Radiation Oncology

## 2023-01-16 ENCOUNTER — Other Ambulatory Visit: Payer: Self-pay

## 2023-01-16 DIAGNOSIS — C50412 Malignant neoplasm of upper-outer quadrant of left female breast: Secondary | ICD-10-CM | POA: Diagnosis not present

## 2023-01-16 DIAGNOSIS — Z170 Estrogen receptor positive status [ER+]: Secondary | ICD-10-CM | POA: Diagnosis not present

## 2023-01-16 DIAGNOSIS — Z510 Encounter for antineoplastic radiation therapy: Secondary | ICD-10-CM | POA: Diagnosis not present

## 2023-01-16 LAB — RAD ONC ARIA SESSION SUMMARY
Course Elapsed Days: 8
Plan Fractions Treated to Date: 6
Plan Prescribed Dose Per Fraction: 2.67 Gy
Plan Total Fractions Prescribed: 15
Plan Total Prescribed Dose: 40.05 Gy
Reference Point Dosage Given to Date: 16.02 Gy
Reference Point Session Dosage Given: 2.67 Gy
Session Number: 6

## 2023-01-17 ENCOUNTER — Ambulatory Visit
Admission: RE | Admit: 2023-01-17 | Discharge: 2023-01-17 | Disposition: A | Discharge status: Home / Self Care | Payer: Medicare HMO | Source: Ambulatory Visit | Attending: Radiation Oncology | Admitting: Radiation Oncology

## 2023-01-17 ENCOUNTER — Other Ambulatory Visit: Payer: Self-pay

## 2023-01-17 DIAGNOSIS — C50412 Malignant neoplasm of upper-outer quadrant of left female breast: Secondary | ICD-10-CM | POA: Diagnosis not present

## 2023-01-17 DIAGNOSIS — Z170 Estrogen receptor positive status [ER+]: Secondary | ICD-10-CM | POA: Diagnosis not present

## 2023-01-17 DIAGNOSIS — Z510 Encounter for antineoplastic radiation therapy: Secondary | ICD-10-CM | POA: Diagnosis not present

## 2023-01-17 LAB — RAD ONC ARIA SESSION SUMMARY
Course Elapsed Days: 9
Plan Fractions Treated to Date: 7
Plan Prescribed Dose Per Fraction: 2.67 Gy
Plan Total Fractions Prescribed: 15
Plan Total Prescribed Dose: 40.05 Gy
Reference Point Dosage Given to Date: 18.69 Gy
Reference Point Session Dosage Given: 2.67 Gy
Session Number: 7

## 2023-01-20 ENCOUNTER — Other Ambulatory Visit: Payer: Self-pay

## 2023-01-20 ENCOUNTER — Ambulatory Visit
Admission: RE | Admit: 2023-01-20 | Discharge: 2023-01-20 | Disposition: A | Discharge status: Home / Self Care | Payer: Medicare HMO | Source: Ambulatory Visit | Attending: Radiation Oncology | Admitting: Radiation Oncology

## 2023-01-20 DIAGNOSIS — Z510 Encounter for antineoplastic radiation therapy: Secondary | ICD-10-CM | POA: Diagnosis not present

## 2023-01-20 DIAGNOSIS — C50412 Malignant neoplasm of upper-outer quadrant of left female breast: Secondary | ICD-10-CM | POA: Insufficient documentation

## 2023-01-20 DIAGNOSIS — Z170 Estrogen receptor positive status [ER+]: Secondary | ICD-10-CM | POA: Diagnosis not present

## 2023-01-20 LAB — RAD ONC ARIA SESSION SUMMARY
Course Elapsed Days: 12
Plan Fractions Treated to Date: 8
Plan Prescribed Dose Per Fraction: 2.67 Gy
Plan Total Fractions Prescribed: 15
Plan Total Prescribed Dose: 40.05 Gy
Reference Point Dosage Given to Date: 21.36 Gy
Reference Point Session Dosage Given: 2.67 Gy
Session Number: 8

## 2023-01-21 ENCOUNTER — Ambulatory Visit
Admission: RE | Admit: 2023-01-21 | Discharge: 2023-01-21 | Disposition: A | Discharge status: Home / Self Care | Payer: Medicare HMO | Source: Ambulatory Visit | Attending: Radiation Oncology | Admitting: Radiation Oncology

## 2023-01-21 ENCOUNTER — Other Ambulatory Visit: Payer: Self-pay

## 2023-01-21 DIAGNOSIS — C50412 Malignant neoplasm of upper-outer quadrant of left female breast: Secondary | ICD-10-CM | POA: Diagnosis not present

## 2023-01-21 DIAGNOSIS — Z510 Encounter for antineoplastic radiation therapy: Secondary | ICD-10-CM | POA: Diagnosis not present

## 2023-01-21 DIAGNOSIS — Z170 Estrogen receptor positive status [ER+]: Secondary | ICD-10-CM | POA: Diagnosis not present

## 2023-01-21 LAB — RAD ONC ARIA SESSION SUMMARY
Course Elapsed Days: 13
Plan Fractions Treated to Date: 9
Plan Prescribed Dose Per Fraction: 2.67 Gy
Plan Total Fractions Prescribed: 15
Plan Total Prescribed Dose: 40.05 Gy
Reference Point Dosage Given to Date: 24.03 Gy
Reference Point Session Dosage Given: 2.67 Gy
Session Number: 9

## 2023-01-22 ENCOUNTER — Other Ambulatory Visit: Payer: Self-pay

## 2023-01-22 ENCOUNTER — Ambulatory Visit
Admission: RE | Admit: 2023-01-22 | Discharge: 2023-01-22 | Disposition: A | Discharge status: Home / Self Care | Payer: Medicare HMO | Source: Ambulatory Visit | Attending: Radiation Oncology | Admitting: Radiation Oncology

## 2023-01-22 DIAGNOSIS — Z510 Encounter for antineoplastic radiation therapy: Secondary | ICD-10-CM | POA: Diagnosis not present

## 2023-01-22 DIAGNOSIS — C50412 Malignant neoplasm of upper-outer quadrant of left female breast: Secondary | ICD-10-CM | POA: Diagnosis not present

## 2023-01-22 DIAGNOSIS — Z170 Estrogen receptor positive status [ER+]: Secondary | ICD-10-CM | POA: Diagnosis not present

## 2023-01-22 LAB — RAD ONC ARIA SESSION SUMMARY
Course Elapsed Days: 14
Plan Fractions Treated to Date: 10
Plan Prescribed Dose Per Fraction: 2.67 Gy
Plan Total Fractions Prescribed: 15
Plan Total Prescribed Dose: 40.05 Gy
Reference Point Dosage Given to Date: 26.7 Gy
Reference Point Session Dosage Given: 2.67 Gy
Session Number: 10

## 2023-01-23 ENCOUNTER — Ambulatory Visit
Admission: RE | Admit: 2023-01-23 | Discharge: 2023-01-23 | Disposition: A | Discharge status: Home / Self Care | Payer: Medicare HMO | Source: Ambulatory Visit | Attending: Radiation Oncology | Admitting: Radiation Oncology

## 2023-01-23 ENCOUNTER — Other Ambulatory Visit: Payer: Self-pay

## 2023-01-23 DIAGNOSIS — C50412 Malignant neoplasm of upper-outer quadrant of left female breast: Secondary | ICD-10-CM | POA: Diagnosis not present

## 2023-01-23 DIAGNOSIS — Z510 Encounter for antineoplastic radiation therapy: Secondary | ICD-10-CM | POA: Diagnosis not present

## 2023-01-23 DIAGNOSIS — Z170 Estrogen receptor positive status [ER+]: Secondary | ICD-10-CM | POA: Diagnosis not present

## 2023-01-23 LAB — RAD ONC ARIA SESSION SUMMARY
Course Elapsed Days: 15
Plan Fractions Treated to Date: 11
Plan Prescribed Dose Per Fraction: 2.67 Gy
Plan Total Fractions Prescribed: 15
Plan Total Prescribed Dose: 40.05 Gy
Reference Point Dosage Given to Date: 29.37 Gy
Reference Point Session Dosage Given: 2.67 Gy
Session Number: 11

## 2023-01-24 ENCOUNTER — Ambulatory Visit
Admission: RE | Admit: 2023-01-24 | Discharge: 2023-01-24 | Disposition: A | Discharge status: Home / Self Care | Payer: Medicare HMO | Source: Ambulatory Visit | Attending: Radiation Oncology | Admitting: Radiation Oncology

## 2023-01-24 ENCOUNTER — Other Ambulatory Visit: Payer: Self-pay

## 2023-01-24 DIAGNOSIS — Z170 Estrogen receptor positive status [ER+]: Secondary | ICD-10-CM | POA: Diagnosis not present

## 2023-01-24 DIAGNOSIS — C50412 Malignant neoplasm of upper-outer quadrant of left female breast: Secondary | ICD-10-CM | POA: Diagnosis not present

## 2023-01-24 DIAGNOSIS — Z510 Encounter for antineoplastic radiation therapy: Secondary | ICD-10-CM | POA: Diagnosis not present

## 2023-01-24 LAB — RAD ONC ARIA SESSION SUMMARY
Course Elapsed Days: 16
Plan Fractions Treated to Date: 12
Plan Prescribed Dose Per Fraction: 2.67 Gy
Plan Total Fractions Prescribed: 15
Plan Total Prescribed Dose: 40.05 Gy
Reference Point Dosage Given to Date: 32.04 Gy
Reference Point Session Dosage Given: 2.67 Gy
Session Number: 12

## 2023-01-24 MED ORDER — RADIAPLEXRX EX GEL: Freq: Once | CUTANEOUS | Status: AC

## 2023-01-27 ENCOUNTER — Other Ambulatory Visit: Payer: Self-pay

## 2023-01-27 ENCOUNTER — Ambulatory Visit
Admission: RE | Admit: 2023-01-27 | Discharge: 2023-01-27 | Disposition: A | Discharge status: Home / Self Care | Payer: Medicare HMO | Source: Ambulatory Visit | Attending: Radiation Oncology | Admitting: Radiation Oncology

## 2023-01-27 DIAGNOSIS — C50412 Malignant neoplasm of upper-outer quadrant of left female breast: Secondary | ICD-10-CM | POA: Diagnosis not present

## 2023-01-27 DIAGNOSIS — Z170 Estrogen receptor positive status [ER+]: Secondary | ICD-10-CM | POA: Diagnosis not present

## 2023-01-27 DIAGNOSIS — Z510 Encounter for antineoplastic radiation therapy: Secondary | ICD-10-CM | POA: Diagnosis not present

## 2023-01-27 LAB — RAD ONC ARIA SESSION SUMMARY
Course Elapsed Days: 19
Plan Fractions Treated to Date: 13
Plan Prescribed Dose Per Fraction: 2.67 Gy
Plan Total Fractions Prescribed: 15
Plan Total Prescribed Dose: 40.05 Gy
Reference Point Dosage Given to Date: 34.71 Gy
Reference Point Session Dosage Given: 2.67 Gy
Session Number: 13

## 2023-01-28 ENCOUNTER — Other Ambulatory Visit: Payer: Self-pay

## 2023-01-28 ENCOUNTER — Ambulatory Visit
Admission: RE | Admit: 2023-01-28 | Discharge: 2023-01-28 | Disposition: A | Discharge status: Home / Self Care | Payer: Medicare HMO | Source: Ambulatory Visit | Attending: Radiation Oncology | Admitting: Radiation Oncology

## 2023-01-28 ENCOUNTER — Ambulatory Visit: Payer: Medicare HMO | Admitting: Radiation Oncology

## 2023-01-28 DIAGNOSIS — Z510 Encounter for antineoplastic radiation therapy: Secondary | ICD-10-CM | POA: Diagnosis not present

## 2023-01-28 DIAGNOSIS — C50412 Malignant neoplasm of upper-outer quadrant of left female breast: Secondary | ICD-10-CM | POA: Diagnosis not present

## 2023-01-28 DIAGNOSIS — Z170 Estrogen receptor positive status [ER+]: Secondary | ICD-10-CM | POA: Diagnosis not present

## 2023-01-28 LAB — RAD ONC ARIA SESSION SUMMARY
Course Elapsed Days: 20
Plan Fractions Treated to Date: 14
Plan Prescribed Dose Per Fraction: 2.67 Gy
Plan Total Fractions Prescribed: 15
Plan Total Prescribed Dose: 40.05 Gy
Reference Point Dosage Given to Date: 37.38 Gy
Reference Point Session Dosage Given: 2.67 Gy
Session Number: 14

## 2023-01-29 ENCOUNTER — Other Ambulatory Visit: Payer: Self-pay

## 2023-01-29 ENCOUNTER — Ambulatory Visit
Admission: RE | Admit: 2023-01-29 | Discharge: 2023-01-29 | Disposition: A | Discharge status: Home / Self Care | Payer: Medicare HMO | Source: Ambulatory Visit | Attending: Radiation Oncology | Admitting: Radiation Oncology

## 2023-01-29 DIAGNOSIS — C50412 Malignant neoplasm of upper-outer quadrant of left female breast: Secondary | ICD-10-CM | POA: Diagnosis not present

## 2023-01-29 DIAGNOSIS — Z170 Estrogen receptor positive status [ER+]: Secondary | ICD-10-CM | POA: Diagnosis not present

## 2023-01-29 DIAGNOSIS — Z510 Encounter for antineoplastic radiation therapy: Secondary | ICD-10-CM | POA: Diagnosis not present

## 2023-01-29 LAB — RAD ONC ARIA SESSION SUMMARY
Course Elapsed Days: 21
Plan Fractions Treated to Date: 15
Plan Prescribed Dose Per Fraction: 2.67 Gy
Plan Total Fractions Prescribed: 15
Plan Total Prescribed Dose: 40.05 Gy
Reference Point Dosage Given to Date: 40.05 Gy
Reference Point Session Dosage Given: 2.67 Gy
Session Number: 15

## 2023-01-29 NOTE — Progress Notes (Signed)
Patient Care Team: Street, Stephanie Coup, MD as PCP - General (Family Medicine) Serena Croissant, MD as Consulting Physician (Hematology and Oncology) Antony Blackbird, MD as Consulting Physician (Radiation Oncology) Emelia Loron, MD as Consulting Physician (General Surgery) Pershing Proud, RN as Oncology Nurse Navigator Donnelly Angelica, RN as Oncology Nurse Navigator  DIAGNOSIS:  Encounter Diagnosis  Name Primary?   Malignant neoplasm of upper-outer quadrant of left breast in female, estrogen receptor positive (HCC) Yes    SUMMARY OF ONCOLOGIC HISTORY: Oncology History  Malignant neoplasm of upper-outer quadrant of left breast in female, estrogen receptor positive (HCC)  11/06/2022 Initial Diagnosis   Prior history of right breast cancer 2010 treated with lumpectomy radiation and 5 years of letrozole Screening mammogram detected left breast mass 5 mm 3 o'clock position, ultrasound-guided biopsy revealed grade 2 ILC with LCIS ER 100% PR 100%, Ki-67 10%, HER2 equivalent, FISH negative   11/20/2022 Cancer Staging   Staging form: Breast, AJCC 8th Edition - Clinical stage from 11/20/2022: Stage IA (cT1a, cN0, cM0, G2, ER+, PR+, HER2-) - Signed by Serena Croissant, MD on 11/20/2022 Stage prefix: Initial diagnosis Histologic grading system: 3 grade system   11/29/2022 Genetic Testing   Negative [facility] CustomNext-Cancer +RNAinsight Panel.  Report date is 11/29/2022.   The CustomNext-Cancer+RNAinsight panel offered by [employer] [facility] includes sequencing and rearrangement analysis for the following 50 genes: APC, ATM, BAP1, BARD1, BMPR1A, BRCA1, BRCA2, BRIP1, CDH1, CDK4, CDKN2A, CHEK2, DICER1, FH, FLCN, MEN1, MET, MLH1, MSH2, MSH6, MUTYH, NF1, NTHL1, PALB2, PMS2, PTEN, RAD51C, RAD51D, SDHA, SDHB, SDHC, SDHD, SMAD4, SMARCA4, STK11, TP53, TSC1, TSC2 and VHL (sequencing and deletion/duplication); AXIN2, CTNNA1, HOXB13, KIT, MITF, MSH3, PDGFRA, POLD1 and POLE (sequencing only); EPCAM and GREM1  (deletion/duplication only). RNA data is routinely analyzed for use in variant interpretation for all genes.   12/03/2022 Surgery   Left lumpectomy: Grade 2 ILC 0.8 cm, margins negative with LCIS, lymphovascular invasion not identified, ER 100%, PR 30%, HER2 negative, Ki-67 10% 0/5 lymph nodes negative   12/20/2022 Oncotype testing   Oncotype DX recurrence score 17 (risk of distant recurrence at 9 years: 5%)     CHIEF COMPLIANT: Follow-up after radiation  INTERVAL HISTORY: [name] is a  67 y.o. female is here because of recent diagnosis of left breast cancer. She presents to the clinic for a follow-up. She reports. She reports that she tolerated radiation fairly well with some mild fatigue.    ALLERGIES:  has No Known Allergies.  MEDICATIONS:  Current Outpatient Medications  Medication Sig Dispense Refill   anastrozole (ARIMIDEX) 1 MG tablet Take 1 tablet (1 mg total) by mouth daily. 90 tablet 3   aspirin EC 81 MG tablet Take 1 tablet (81 mg total) by mouth daily. Swallow whole. 90 tablet 3   cetirizine (ZYRTEC) 10 MG tablet Take 10 mg by mouth daily as needed for allergies.     valsartan-hydrochlorothiazide (DIOVAN-HCT) 160-12.5 MG per tablet Take 1 tablet by mouth daily.     No current facility-administered medications for this visit.    PHYSICAL EXAMINATION: ECOG PERFORMANCE STATUS: 0 - Asymptomatic  Vitals:   02/06/23 1114  BP: (!) 142/73  Pulse: (!) 53  Resp: 18  Temp: 97.9 F (36.6 C)  SpO2: 100%   Filed Weights   02/06/23 1114  Weight: 176 lb 4.8 oz (80 kg)      LABORATORY DATA:  I have reviewed the data as listed    Latest Ref Rng & Units 11/20/2022   12:20  PM 10/24/2021   11:06 AM 12/29/2014   10:26 AM  CMP  Glucose 70 - 99 mg/dL 829  99  90   BUN 8 - 23 mg/dL 28  16  56.2   Creatinine 0.44 - 1.00 mg/dL 1.30  8.65  0.8   Sodium 135 - 145 mmol/L 136  135  140   Potassium 3.5 - 5.1 mmol/L 4.4  4.7  4.2   Chloride 98 - 111 mmol/L 97  95    CO2 22 - 32  mmol/L 33  27  27   Calcium 8.9 - 10.3 mg/dL 9.7  8.9  8.8   Total Protein 6.5 - 8.1 g/dL 7.6   6.6   Total Bilirubin 0.3 - 1.2 mg/dL 0.5   7.84   Alkaline Phos 38 - 126 U/L 133   110   AST 15 - 41 U/L 32   24   ALT 0 - 44 U/L 32   20     Lab Results  Component Value Date   WBC 6.5 11/20/2022   HGB 13.0 11/20/2022   HCT 39.5 11/20/2022   MCV 89.6 11/20/2022   PLT 265 11/20/2022   NEUTROABS 4.0 11/20/2022    ASSESSMENT & PLAN:  Malignant neoplasm of upper-outer quadrant of left breast in female, estrogen receptor positive (HCC) 11/06/2022:Screening mammogram detected left breast mass 5 mm 3 o'clock position, ultrasound-guided biopsy revealed grade 2 ILC with LCIS ER 100% PR 100%, Ki-67 10%, HER2 equivalent, FISH negative   (Prior history of right breast cancer 2010 treated with lumpectomy radiation and 5 years of letrozole)   12/03/2022:Left lumpectomy: Grade 2 ILC 0.8 cm, margins negative with LCIS, lymphovascular invasion not identified, ER 100%, PR 30%, HER2 negative, Ki-67 10% 0/5 lymph nodes negative Oncotype DX recurrence score 17 (risk of distant recurrence at 9 years: 5%)  02/05/2023: Completed adjuvant radiation  Treatment plan: Antiestrogen therapy with anastrozole 1 mg daily x 10 years started 02/06/2023 Anastrozole counseling: We discussed the risks and benefits of anti-estrogen therapy with aromatase inhibitors. These include but not limited to insomnia, hot flashes, mood changes, vaginal dryness, bone density loss, and weight gain. We strongly believe that the benefits far outweigh the risks. Patient understands these risks and consented to starting treatment. Planned treatment duration is 10 years.  Return to clinic in 3 months for survivorship care plan visit    No orders of the defined types were placed in this encounter.  The patient has a good understanding of the overall plan. she agrees with it. she will call with any problems that may develop before the next  visit here. Total time spent: 30 mins including face to face time and time spent for planning, charting and co-ordination of care   Tamsen Meek, MD 02/06/23    I Janan Ridge am acting as a Neurosurgeon for [employer]  I have reviewed the above documentation for accuracy and completeness, and I agree with the above.

## 2023-01-30 ENCOUNTER — Other Ambulatory Visit: Payer: Self-pay

## 2023-01-30 ENCOUNTER — Ambulatory Visit
Admission: RE | Admit: 2023-01-30 | Discharge: 2023-01-30 | Disposition: A | Discharge status: Home / Self Care | Payer: Medicare HMO | Source: Ambulatory Visit | Attending: Radiation Oncology | Admitting: Radiation Oncology

## 2023-01-30 ENCOUNTER — Telehealth: Payer: Self-pay | Admitting: Adult Health

## 2023-01-30 DIAGNOSIS — Z510 Encounter for antineoplastic radiation therapy: Secondary | ICD-10-CM | POA: Diagnosis not present

## 2023-01-30 DIAGNOSIS — C50412 Malignant neoplasm of upper-outer quadrant of left female breast: Secondary | ICD-10-CM | POA: Diagnosis not present

## 2023-01-30 DIAGNOSIS — Z170 Estrogen receptor positive status [ER+]: Secondary | ICD-10-CM | POA: Diagnosis not present

## 2023-01-30 LAB — RAD ONC ARIA SESSION SUMMARY
Course Elapsed Days: 22
Plan Fractions Treated to Date: 1
Plan Prescribed Dose Per Fraction: 2 Gy
Plan Total Fractions Prescribed: 5
Plan Total Prescribed Dose: 10 Gy
Reference Point Dosage Given to Date: 2 Gy
Reference Point Session Dosage Given: 2 Gy
Session Number: 16

## 2023-01-30 NOTE — Telephone Encounter (Signed)
Patient is aware of upcoming appointment dates/times.  

## 2023-01-31 ENCOUNTER — Other Ambulatory Visit: Payer: Self-pay

## 2023-01-31 ENCOUNTER — Ambulatory Visit
Admission: RE | Admit: 2023-01-31 | Discharge: 2023-01-31 | Disposition: A | Discharge status: Home / Self Care | Payer: Medicare HMO | Source: Ambulatory Visit | Attending: Radiation Oncology | Admitting: Radiation Oncology

## 2023-01-31 DIAGNOSIS — C50412 Malignant neoplasm of upper-outer quadrant of left female breast: Secondary | ICD-10-CM | POA: Diagnosis not present

## 2023-01-31 DIAGNOSIS — Z510 Encounter for antineoplastic radiation therapy: Secondary | ICD-10-CM | POA: Diagnosis not present

## 2023-01-31 DIAGNOSIS — Z170 Estrogen receptor positive status [ER+]: Secondary | ICD-10-CM | POA: Diagnosis not present

## 2023-01-31 LAB — RAD ONC ARIA SESSION SUMMARY
Course Elapsed Days: 23
Plan Fractions Treated to Date: 2
Plan Prescribed Dose Per Fraction: 2 Gy
Plan Total Fractions Prescribed: 5
Plan Total Prescribed Dose: 10 Gy
Reference Point Dosage Given to Date: 4 Gy
Reference Point Session Dosage Given: 2 Gy
Session Number: 17

## 2023-02-03 ENCOUNTER — Other Ambulatory Visit: Payer: Self-pay

## 2023-02-03 ENCOUNTER — Ambulatory Visit
Admission: RE | Admit: 2023-02-03 | Discharge: 2023-02-03 | Disposition: A | Discharge status: Home / Self Care | Payer: Medicare HMO | Source: Ambulatory Visit | Attending: Radiation Oncology | Admitting: Radiation Oncology

## 2023-02-03 DIAGNOSIS — Z510 Encounter for antineoplastic radiation therapy: Secondary | ICD-10-CM | POA: Diagnosis not present

## 2023-02-03 DIAGNOSIS — Z170 Estrogen receptor positive status [ER+]: Secondary | ICD-10-CM | POA: Diagnosis not present

## 2023-02-03 DIAGNOSIS — C50412 Malignant neoplasm of upper-outer quadrant of left female breast: Secondary | ICD-10-CM | POA: Diagnosis not present

## 2023-02-03 LAB — RAD ONC ARIA SESSION SUMMARY
Course Elapsed Days: 26
Plan Fractions Treated to Date: 3
Plan Prescribed Dose Per Fraction: 2 Gy
Plan Total Fractions Prescribed: 5
Plan Total Prescribed Dose: 10 Gy
Reference Point Dosage Given to Date: 6 Gy
Reference Point Session Dosage Given: 2 Gy
Session Number: 18

## 2023-02-04 ENCOUNTER — Other Ambulatory Visit: Payer: Self-pay

## 2023-02-04 ENCOUNTER — Ambulatory Visit
Admission: RE | Admit: 2023-02-04 | Discharge: 2023-02-04 | Disposition: A | Discharge status: Home / Self Care | Payer: Medicare HMO | Source: Ambulatory Visit | Attending: Radiation Oncology | Admitting: Radiation Oncology

## 2023-02-04 DIAGNOSIS — Z170 Estrogen receptor positive status [ER+]: Secondary | ICD-10-CM | POA: Diagnosis not present

## 2023-02-04 DIAGNOSIS — Z510 Encounter for antineoplastic radiation therapy: Secondary | ICD-10-CM | POA: Diagnosis not present

## 2023-02-04 DIAGNOSIS — C50412 Malignant neoplasm of upper-outer quadrant of left female breast: Secondary | ICD-10-CM | POA: Diagnosis not present

## 2023-02-04 LAB — RAD ONC ARIA SESSION SUMMARY
Course Elapsed Days: 27
Plan Fractions Treated to Date: 4
Plan Prescribed Dose Per Fraction: 2 Gy
Plan Total Fractions Prescribed: 5
Plan Total Prescribed Dose: 10 Gy
Reference Point Dosage Given to Date: 8 Gy
Reference Point Session Dosage Given: 2 Gy
Session Number: 19

## 2023-02-04 MED ORDER — RADIAPLEXRX EX GEL: Freq: Once | CUTANEOUS | Status: AC

## 2023-02-05 ENCOUNTER — Ambulatory Visit
Admission: RE | Admit: 2023-02-05 | Discharge: 2023-02-05 | Disposition: A | Discharge status: Home / Self Care | Payer: Medicare HMO | Source: Ambulatory Visit | Attending: Radiation Oncology | Admitting: Radiation Oncology

## 2023-02-05 ENCOUNTER — Other Ambulatory Visit: Payer: Self-pay

## 2023-02-05 DIAGNOSIS — C50412 Malignant neoplasm of upper-outer quadrant of left female breast: Secondary | ICD-10-CM | POA: Diagnosis not present

## 2023-02-05 DIAGNOSIS — Z510 Encounter for antineoplastic radiation therapy: Secondary | ICD-10-CM | POA: Diagnosis not present

## 2023-02-05 DIAGNOSIS — Z170 Estrogen receptor positive status [ER+]: Secondary | ICD-10-CM | POA: Diagnosis not present

## 2023-02-05 LAB — RAD ONC ARIA SESSION SUMMARY
Course Elapsed Days: 28
Plan Fractions Treated to Date: 5
Plan Prescribed Dose Per Fraction: 2 Gy
Plan Total Fractions Prescribed: 5
Plan Total Prescribed Dose: 10 Gy
Reference Point Dosage Given to Date: 10 Gy
Reference Point Session Dosage Given: 2 Gy
Session Number: 20

## 2023-02-06 ENCOUNTER — Inpatient Hospital Stay: Payer: Medicare HMO | Attending: Hematology and Oncology | Admitting: Hematology and Oncology

## 2023-02-06 ENCOUNTER — Other Ambulatory Visit: Payer: Self-pay

## 2023-02-06 VITALS — BP 142/73 | HR 53 | Temp 97.9°F | Resp 18 | Ht 67.0 in | Wt 176.3 lb

## 2023-02-06 DIAGNOSIS — Z923 Personal history of irradiation: Secondary | ICD-10-CM | POA: Diagnosis not present

## 2023-02-06 DIAGNOSIS — Z79811 Long term (current) use of aromatase inhibitors: Secondary | ICD-10-CM | POA: Diagnosis not present

## 2023-02-06 DIAGNOSIS — C50412 Malignant neoplasm of upper-outer quadrant of left female breast: Secondary | ICD-10-CM | POA: Insufficient documentation

## 2023-02-06 DIAGNOSIS — R5383 Other fatigue: Secondary | ICD-10-CM | POA: Diagnosis not present

## 2023-02-06 DIAGNOSIS — Z79899 Other long term (current) drug therapy: Secondary | ICD-10-CM | POA: Diagnosis not present

## 2023-02-06 DIAGNOSIS — Z170 Estrogen receptor positive status [ER+]: Secondary | ICD-10-CM | POA: Diagnosis not present

## 2023-02-06 MED ORDER — ANASTROZOLE 1 MG PO TABS: 1.0000 mg | ORAL_TABLET | Freq: Every day | ORAL | 3 refills | Status: DC

## 2023-02-06 NOTE — Radiation Completion Notes (Signed)
Patient Name: TAIGEN, BRANYAN MRN: [id number] Date of Birth: 07-06-1956 Referring Physician: Serena Croissant, M.D. Date of Service: 2023-02-06 Radiation Oncologist: Arnette Schaumann, M.D. [facility] [place]                             RADIATION ONCOLOGY END OF TREATMENT NOTE     Diagnosis: C50.412 Malignant neoplasm of upper-outer quadrant of left female breast Staging on 2015-01-03: Breast cancer, right breast (HCC) T=T1c, N=N0, M=M0 Staging on 2022-11-20: Malignant neoplasm of upper-outer quadrant of left breast in female, estrogen receptor positive (HCC) T=cT1a, N=cN0, M=cM0 Intent: Curative     ==========DELIVERED PLANS==========  First Treatment Date: 2023-01-08 - Last Treatment Date: 2023-02-05   Plan Name: Breast_L_BH Site: Breast, Left Technique: 3D Mode: Photon Dose Per Fraction: 2.67 Gy Prescribed Dose (Delivered / Prescribed): 40.05 Gy / 40.05 Gy Prescribed Fxs (Delivered / Prescribed): 15 / 15   Plan Name: Brst_L_Bst_BH Site: Breast, Left Technique: 3D Mode: Photon Dose Per Fraction: 2 Gy Prescribed Dose (Delivered / Prescribed): 10 Gy / 10 Gy Prescribed Fxs (Delivered / Prescribed): 5 / 5     ==========ON TREATMENT VISIT DATES========== 2023-01-14, 2023-01-21, 2023-01-24, 2023-02-04     ==========UPCOMING VISITS==========       ==========APPENDIX - ON TREATMENT VISIT NOTES==========   See weekly On Treatment Notes in Epic for details.

## 2023-02-06 NOTE — Assessment & Plan Note (Signed)
11/06/2022:Screening mammogram detected left breast mass 5 mm 3 o'clock position, ultrasound-guided biopsy revealed grade 2 ILC with LCIS ER 100% PR 100%, Ki-67 10%, HER2 equivalent, FISH negative   (Prior history of right breast cancer 2010 treated with lumpectomy radiation and 5 years of letrozole)   12/03/2022:Left lumpectomy: Grade 2 ILC 0.8 cm, margins negative with LCIS, lymphovascular invasion not identified, ER 100%, PR 30%, HER2 negative, Ki-67 10% 0/5 lymph nodes negative Oncotype DX recurrence score 17 (risk of distant recurrence at 9 years: 5%)  02/05/2023: Completed adjuvant radiation  Treatment plan: Antiestrogen therapy with anastrozole 1 mg daily x 10 years started 02/06/2023 Anastrozole counseling: We discussed the risks and benefits of anti-estrogen therapy with aromatase inhibitors. These include but not limited to insomnia, hot flashes, mood changes, vaginal dryness, bone density loss, and weight gain. We strongly believe that the benefits far outweigh the risks. Patient understands these risks and consented to starting treatment. Planned treatment duration is 10 years.  Return to clinic in 3 months for survivorship care plan visit

## 2023-03-07 ENCOUNTER — Encounter: Payer: Self-pay | Admitting: Radiation Oncology

## 2023-03-09 NOTE — Progress Notes (Signed)
Radiation Oncology         [phone] ________________________________  Name: BAYLE CALVO MRN: [id number]  Date: 03/10/2023  DOB: [birth year]  Follow-Up Visit Note  CC: Street, Stephanie Coup, MD  Serena Croissant, MD  No diagnosis found.  Diagnosis: Stage IA (cT1a, cN0, cM0) Left Breast UOQ, Invasive Lobular Carcinoma, ER+ / PR+ / Her2-, Grade 2: s/p lumpectomy and SLN evaluation      Interval Since Last Radiation: 1 month and 3 days   Indication for treatment: Curative       Radiation treatment dates: 01/08/23 through 02/05/23  Site/dose:  1) left breast - 40.05 Gy delivered in 15 Fx at 2.67 Gy/Fx 2) left breast boost - 10 Gy delivered in 5 Fx at 2 Gy/Fx Beams/energy: 10X-FFF Technique/Mode: 3D / Photon   Narrative:  The patient returns today for routine follow-up.  The patient tolerated radiation treatment relatively well. During her final weekly treatment check on 02/04/23, the patient endorsed mild tenderness to to the left breast, fatigue, redness, and itching. Physical exam performed on that same date showed some hyperpigmentation changes and erythema to the left breast area.  No skin breakdown was appreciated. She was given Radiaplex which she used as directed.      Since completing radiation therapy, the patient followed up with Dr. Pamelia Hoit on 02/06/23. During which time, the patient consented to start antiestrogen therapy consisting of anastrozole x 10 years.     ***                         Allergies:  has No Known Allergies.  Meds: Current Outpatient Medications  Medication Sig Dispense Refill   anastrozole (ARIMIDEX) 1 MG tablet Take 1 tablet (1 mg total) by mouth daily. 90 tablet 3   aspirin EC 81 MG tablet Take 1 tablet (81 mg total) by mouth daily. Swallow whole. 90 tablet 3   cetirizine (ZYRTEC) 10 MG tablet Take 10 mg by mouth daily as needed for allergies.     valsartan-hydrochlorothiazide (DIOVAN-HCT) 160-12.5 MG per tablet Take 1 tablet by mouth daily.      No current facility-administered medications for this encounter.    Physical Findings: The patient is in no acute distress. Patient is alert and oriented.  vitals were not taken for this visit. .  No significant changes. Lungs are clear to auscultation bilaterally. Heart has regular rate and rhythm. No palpable cervical, supraclavicular, or axillary adenopathy. Abdomen soft, non-tender, normal bowel sounds.  Right Breast: no palpable mass, nipple discharge or bleeding. Left Breast: ***  Lab Findings: Lab Results  Component Value Date   WBC 6.5 11/20/2022   HGB 13.0 11/20/2022   HCT 39.5 11/20/2022   MCV 89.6 11/20/2022   PLT 265 11/20/2022    Radiographic Findings: No results found.  Impression:  Stage IA (cT1a, cN0, cM0) Left Breast UOQ, Invasive Lobular Carcinoma, ER+ / PR+ / Her2-, Grade 2: s/p lumpectomy and SLN evaluation      The patient is recovering from the effects of radiation.  ***  Plan:  ***   *** minutes of total time was spent for this patient encounter, including preparation, face-to-face counseling with the patient and coordination of care, physical exam, and documentation of the encounter. ____________________________________  Billie Lade, PhD, MD  This document serves as a record of services personally performed by Antony Blackbird, MD. It was created on his behalf by Neena Rhymes, a trained medical scribe. The creation of  this record is based on the scribe's personal observations and the provider's statements to them. This document has been checked and approved by the attending provider.

## 2023-03-09 NOTE — Progress Notes (Signed)
  Radiation Oncology         [phone] ________________________________  Name: [name] MRN: [id number]  Date: 03/10/2023  DOB: 04-09-1956  End of Treatment Note  Diagnosis: Stage IA (cT1a, cN0, cM0) Left Breast UOQ, Invasive Lobular Carcinoma, ER+ / PR+ / Her2-, Grade 2: s/p lumpectomy and SLN evaluation      Indication for treatment: Curative        Radiation treatment dates: 01/08/23 through 02/05/23   Site/dose:  1) left breast - 40.05 Gy delivered in 15 Fx at 2.67 Gy/Fx 2) left breast boost - 10 Gy delivered in 5 Fx at 2 Gy/Fx  Beams/energy: 10X-FFF  Technique/Mode: 3D / Photon   Narrative: The patient tolerated radiation treatment relatively well. During her final weekly treatment check on 02/04/23, the patient endorsed mild tenderness to to the left breast, fatigue, redness, and itching. Physical exam performed on that same date showed some hyperpigmentation changes and erythema to the left breast area.  No skin breakdown was appreciated. She was given Radiaplex which she used as directed.   Plan: The patient has completed radiation treatment. The patient will return to radiation oncology clinic for routine followup in one month. I advised them to call or return sooner if they have any questions or concerns related to their recovery or treatment.  -----------------------------------  Billie Lade, PhD, MD  This document serves as a record of services personally performed by Antony Blackbird, MD. It was created on his behalf by Neena Rhymes, a trained medical scribe. The creation of this record is based on the scribe's personal observations and the provider's statements to them. This document has been checked and approved by the attending provider.

## 2023-03-10 ENCOUNTER — Ambulatory Visit
Admission: RE | Admit: 2023-03-10 | Discharge: 2023-03-10 | Disposition: A | Discharge status: Home / Self Care | Payer: Medicare HMO | Source: Ambulatory Visit | Attending: Radiation Oncology | Admitting: Radiation Oncology

## 2023-03-10 ENCOUNTER — Encounter: Payer: Self-pay | Admitting: Radiation Oncology

## 2023-03-10 VITALS — BP 138/78 | HR 56 | Temp 97.5°F | Resp 18 | Ht 67.0 in | Wt 180.2 lb

## 2023-03-10 DIAGNOSIS — Z170 Estrogen receptor positive status [ER+]: Secondary | ICD-10-CM | POA: Diagnosis not present

## 2023-03-10 DIAGNOSIS — Z923 Personal history of irradiation: Secondary | ICD-10-CM | POA: Insufficient documentation

## 2023-03-10 DIAGNOSIS — C50412 Malignant neoplasm of upper-outer quadrant of left female breast: Secondary | ICD-10-CM

## 2023-03-10 HISTORY — DX: Personal history of irradiation: Z92.3

## 2023-03-10 NOTE — Progress Notes (Signed)
[name] is here today for follow up post radiation to the breast.   Breast Side:Right   They completed their radiation on: 02/05/23   Does the patient complain of any of the following: Post radiation skin issues: No Breast Tenderness: No Breast Swelling: No Lymphadema: No Range of Motion limitations: No Fatigue post radiation: No Appetite good/fair/poor: Good  Additional comments if applicable:   BP 138/78 (BP Location: Left Arm, Patient Position: Sitting)   Pulse (!) 56   Temp (!) 97.5 F (36.4 C) (Temporal)   Resp 18   Ht 5\' 7"  (1.702 m)   Wt 180 lb 4 oz (81.8 kg)   SpO2 100%   BMI 28.23 kg/m

## 2023-04-02 DIAGNOSIS — Z01419 Encounter for gynecological examination (general) (routine) without abnormal findings: Secondary | ICD-10-CM | POA: Diagnosis not present

## 2023-04-07 ENCOUNTER — Ambulatory Visit: Payer: Medicare HMO

## 2023-04-28 ENCOUNTER — Ambulatory Visit: Payer: Medicare HMO | Attending: Hematology and Oncology

## 2023-04-28 VITALS — Wt 183.5 lb

## 2023-04-28 DIAGNOSIS — Z483 Aftercare following surgery for neoplasm: Secondary | ICD-10-CM | POA: Insufficient documentation

## 2023-04-28 NOTE — Therapy (Signed)
OUTPATIENT PHYSICAL THERAPY SOZO SCREENING NOTE   Patient Name: [name] MRN: [id number] DOB:1955/12/06, 67 y.o., female Today's Date: 04/28/2023  PCP: Street, Stephanie Coup, MD REFERRING PROVIDER: Serena Croissant, MD   PT End of Session - 04/28/23 0804     Visit Number 2   # unchnaged due to screen only   PT Start Time 0802    PT Stop Time 0806    PT Time Calculation (min) 4 min    Activity Tolerance Patient tolerated treatment well    Behavior During Therapy [facility] for tasks assessed/performed             Past Medical History:  Diagnosis Date   Breast cancer (HCC)    Breast cancer, right breast (HCC) 12/31/2012   Chronic allergic rhinitis due to pollen    Essential hypertension 10/08/2016   History of breast cancer in female 10/08/2016   History of radiation therapy    Right breast-  01/08/23-02/05/23-Dr. Antony Blackbird   Hyperlipidemia    Obesity    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/2016   Past Surgical History:  Procedure Laterality Date   AXILLARY SENTINEL NODE BIOPSY Left 12/03/2022   Procedure: LEFT AXILLARY SENTINEL NODE BIOPSY;  Surgeon: Emelia Loron, MD;  Location: [facility] OR;  Service: General;  Laterality: Left;   BREAST LUMPECTOMY  08/19/2008   right   BREAST LUMPECTOMY WITH RADIOACTIVE SEED AND SENTINEL LYMPH NODE BIOPSY Left 12/03/2022   Procedure: LEFT BREAST LUMPECTOMY WITH RADIOACTIVE SEED;  Surgeon: Emelia Loron, MD;  Location: [facility] OR;  Service: General;  Laterality: Left;   CHOLECYSTECTOMY  08/20/2003   COLONOSCOPY  2015   TUBAL LIGATION     Patient Active Problem List   Diagnosis Date Noted   Genetic testing 12/10/2022   Malignant neoplasm of upper-outer quadrant of left breast in female, estrogen receptor positive (HCC) 11/18/2022   Coronary disease nonobstructive based on CT coronary done in spring 2023 12/28/2021   Atypical chest pain 10/17/2021   Dyslipidemia 10/17/2021   Essential hypertension 10/08/2016   Osteopenia due to  cancer therapy 10/08/2016   History of breast cancer in female 10/08/2016   Vaginal dryness 01/01/2013   Hot flashes 01/01/2013   Breast cancer, right breast (HCC) 12/31/2012    REFERRING DIAG: left breast cancer at risk for lymphedema  THERAPY DIAG:  Aftercare following surgery for neoplasm  PERTINENT HISTORY: Hx of Rt breast cancer with lumpectomy and radiation in 2010 with 6 negative nodes removed. Now with Lt breast cancer ILC grade 2 ER/PR positive Ki67% 10% with Lt lumpectomy on 12/03/22 with 5 negative nodes removed.  Hx of frozen shoulder bil.  Oncotype testing pending. Radiation for sure.    PRECAUTIONS: left UE Lymphedema risk, None  SUBJECTIVE: Pt returns for her first 3 month L-Dex screen. "I'm doing great!"  PAIN:  Are you having pain? No  SOZO SCREENING: Patient was assessed today using the SOZO machine to determine the lymphedema index score. This was compared to her baseline score. It was determined that she is within the recommended range when compared to her baseline and no further action is needed at this time. She will continue SOZO screenings. These are done every 3 months for 2 years post operatively followed by every 6 months for 2 years, and then annually.   L-DEX FLOWSHEETS - 04/28/23 0800       L-DEX LYMPHEDEMA SCREENING   Measurement Type Unilateral    L-DEX MEASUREMENT EXTREMITY Upper Extremity    POSITION  Standing    DOMINANT SIDE Left    At Risk Side Right    BASELINE SCORE (UNILATERAL) 0.8    L-DEX SCORE (UNILATERAL) 5.2    VALUE CHANGE (UNILAT) 4.4               Arrica, Saidi, PTA 04/28/2023, 8:06 AM

## 2023-05-20 ENCOUNTER — Inpatient Hospital Stay: Payer: Medicare HMO | Attending: Adult Health | Admitting: Adult Health

## 2023-05-20 ENCOUNTER — Encounter: Payer: Self-pay | Admitting: Adult Health

## 2023-05-20 VITALS — BP 152/75 | HR 60 | Temp 98.8°F | Resp 16 | Wt 182.5 lb

## 2023-05-20 DIAGNOSIS — Z923 Personal history of irradiation: Secondary | ICD-10-CM | POA: Diagnosis not present

## 2023-05-20 DIAGNOSIS — Z8042 Family history of malignant neoplasm of prostate: Secondary | ICD-10-CM | POA: Diagnosis not present

## 2023-05-20 DIAGNOSIS — Z8051 Family history of malignant neoplasm of kidney: Secondary | ICD-10-CM | POA: Diagnosis not present

## 2023-05-20 DIAGNOSIS — Z1732 Human epidermal growth factor receptor 2 negative status: Secondary | ICD-10-CM | POA: Diagnosis not present

## 2023-05-20 DIAGNOSIS — Z79811 Long term (current) use of aromatase inhibitors: Secondary | ICD-10-CM | POA: Diagnosis not present

## 2023-05-20 DIAGNOSIS — C50412 Malignant neoplasm of upper-outer quadrant of left female breast: Secondary | ICD-10-CM | POA: Insufficient documentation

## 2023-05-20 DIAGNOSIS — Z170 Estrogen receptor positive status [ER+]: Secondary | ICD-10-CM | POA: Insufficient documentation

## 2023-05-20 DIAGNOSIS — Z1721 Progesterone receptor positive status: Secondary | ICD-10-CM | POA: Insufficient documentation

## 2023-05-20 DIAGNOSIS — Z87891 Personal history of nicotine dependence: Secondary | ICD-10-CM | POA: Diagnosis not present

## 2023-05-20 NOTE — Progress Notes (Signed)
SURVIVORSHIP VISIT:  BRIEF ONCOLOGIC HISTORY:  Oncology History  Malignant neoplasm of upper-outer quadrant of left breast in female, estrogen receptor positive (HCC)  11/06/2022 Initial Diagnosis   Prior history of right breast cancer 2010 treated with lumpectomy radiation and 5 years of letrozole Screening mammogram detected left breast mass 5 mm 3 o'clock position, ultrasound-guided biopsy revealed grade 2 ILC with LCIS ER 100% PR 100%, Ki-67 10%, HER2 equivalent, FISH negative   11/20/2022 Cancer Staging   Staging form: Breast, AJCC 8th Edition - Clinical stage from 11/20/2022: Stage IA (cT1a, cN0, cM0, G2, ER+, PR+, HER2-) - Signed by Serena Croissant, MD on 11/20/2022 Stage prefix: Initial diagnosis Histologic grading system: 3 grade system   11/29/2022 Genetic Testing   Negative [facility] CustomNext-Cancer +RNAinsight Panel.  Report date is 11/29/2022.   The CustomNext-Cancer+RNAinsight panel offered by [facility] includes sequencing and rearrangement analysis for the following 50 genes: APC, ATM, BAP1, BARD1, BMPR1A, BRCA1, BRCA2, BRIP1, CDH1, CDK4, CDKN2A, CHEK2, DICER1, FH, FLCN, MEN1, MET, MLH1, MSH2, MSH6, MUTYH, NF1, NTHL1, PALB2, PMS2, PTEN, RAD51C, RAD51D, SDHA, SDHB, SDHC, SDHD, SMAD4, SMARCA4, STK11, TP53, TSC1, TSC2 and VHL (sequencing and deletion/duplication); AXIN2, CTNNA1, HOXB13, KIT, MITF, MSH3, PDGFRA, POLD1 and POLE (sequencing only); EPCAM and GREM1 (deletion/duplication only). RNA data is routinely analyzed for use in variant interpretation for all genes.   12/03/2022 Surgery   Left lumpectomy: Grade 2 ILC 0.8 cm, margins negative with LCIS, lymphovascular invasion not identified, ER 100%, PR 30%, HER2 negative, Ki-67 10% 0/5 lymph nodes negative   12/20/2022 Oncotype testing   Oncotype DX recurrence score 17 (risk of distant recurrence at 9 years: 5%)   01/08/2023 - 02/05/2023 Radiation Therapy   Plan Name: Breast_L_BH Site: Breast, Left Technique: 3D Mode: Photon Dose Per  Fraction: 2.67 Gy Prescribed Dose (Delivered / Prescribed): 40.05 Gy / 40.05 Gy Prescribed Fxs (Delivered / Prescribed): 15 / 15   Plan Name: Brst_L_Bst_BH Site: Breast, Left Technique: 3D Mode: Photon Dose Per Fraction: 2 Gy Prescribed Dose (Delivered / Prescribed): 10 Gy / 10 Gy Prescribed Fxs (Delivered / Prescribed): 5 / 5   01/2023 -  Anti-estrogen oral therapy   Anastrozole x 10 years     INTERVAL HISTORY:  [name] to review her survivorship care plan detailing her treatment course for breast cancer, as well as monitoring long-term side effects of that treatment, education regarding health maintenance, screening, and overall wellness and health promotion.     Overall, [name] reports feeling quite well.  She is taking anastrozole daily.  She has some hot flashes as well.  She denies significant arthralgias/vaginal dryness.    REVIEW OF SYSTEMS:  Review of Systems  Constitutional:  Negative for appetite change, chills, fatigue, fever and unexpected weight change.  HENT:   Negative for hearing loss, lump/mass and trouble swallowing.   Eyes:  Negative for eye problems and icterus.  Respiratory:  Negative for chest tightness, cough and shortness of breath.   Cardiovascular:  Negative for chest pain, leg swelling and palpitations.  Gastrointestinal:  Negative for abdominal distention, abdominal pain, constipation, diarrhea, nausea and vomiting.  Endocrine: Positive for hot flashes.  Genitourinary:  Negative for difficulty urinating.   Musculoskeletal:  Negative for arthralgias.  Skin:  Negative for itching and rash.  Neurological:  Negative for dizziness, extremity weakness, headaches and numbness.  Hematological:  Negative for adenopathy. Does not bruise/bleed easily.  Psychiatric/Behavioral:  Negative for depression. The patient is not nervous/anxious.   Breast: Denies any new nodularity, masses,  tenderness, nipple changes, or nipple discharge.    PAST  MEDICAL/SURGICAL HISTORY:  Past Medical History:  Diagnosis Date   Breast cancer (HCC)    Breast cancer, right breast (HCC) 12/31/2012   Chronic allergic rhinitis due to pollen    Essential hypertension 10/08/2016   History of breast cancer in female 10/08/2016   History of radiation therapy    Right breast-  01/08/23-02/05/23-Dr. Antony Blackbird   Hyperlipidemia    Obesity    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/2016   Past Surgical History:  Procedure Laterality Date   AXILLARY SENTINEL NODE BIOPSY Left 12/03/2022   Procedure: LEFT AXILLARY SENTINEL NODE BIOPSY;  Surgeon: Emelia Loron, MD;  Location: [facility] OR;  Service: General;  Laterality: Left;   BREAST LUMPECTOMY  08/19/2008   right   BREAST LUMPECTOMY WITH RADIOACTIVE SEED AND SENTINEL LYMPH NODE BIOPSY Left 12/03/2022   Procedure: LEFT BREAST LUMPECTOMY WITH RADIOACTIVE SEED;  Surgeon: Emelia Loron, MD;  Location: [facility] OR;  Service: General;  Laterality: Left;   CHOLECYSTECTOMY  08/20/2003   COLONOSCOPY  2015   TUBAL LIGATION       ALLERGIES:  No Known Allergies   CURRENT MEDICATIONS:  Outpatient Encounter Medications as of 05/20/2023  Medication Sig   anastrozole (ARIMIDEX) 1 MG tablet Take 1 tablet (1 mg total) by mouth daily.   aspirin EC 81 MG tablet Take 1 tablet (81 mg total) by mouth daily. Swallow whole.   calcium carbonate (OS-CAL) 1250 (500 Ca) MG chewable tablet Chew 1 tablet by mouth daily.   cetirizine (ZYRTEC) 10 MG tablet Take 10 mg by mouth daily as needed for allergies.   Cholecalciferol (D 1000) 25 MCG (1000 UT) capsule Take 1,000 Units by mouth daily.   valsartan-hydrochlorothiazide (DIOVAN-HCT) 160-12.5 MG per tablet Take 1 tablet by mouth daily.   No facility-administered encounter medications on file as of 05/20/2023.     ONCOLOGIC FAMILY HISTORY:  Family History  Problem Relation Age of Onset   Hyperlipidemia Mother    Diabetes Mother    Hypertension Mother    Kidney cancer Father  19   Heart disease Father    Diabetes Father    Prostate cancer Maternal Grandfather        dx 65s     SOCIAL HISTORY:  Social History   Socioeconomic History   Marital status: Widowed    Spouse name: Not on file   Number of children: Not on file   Years of education: Not on file   Highest education level: Not on file  Occupational History   Not on file  Tobacco Use   Smoking status: Former    Types: Cigarettes   Smokeless tobacco: Never   Tobacco comments:    Quit over 25 years ago  Substance and Sexual Activity   Alcohol use: Yes    Comment: Occasional   Drug use: No   Sexual activity: Yes    Birth control/protection: Post-menopausal, Surgical  Other Topics Concern   Not on file  Social History Narrative   Not on file   Social Determinants of Health   Financial Resource Strain: Not on file  Food Insecurity: No Food Insecurity (12/31/2022)   Hunger Vital Sign    Worried About Running Out of Food in the Last Year: Never true    Ran Out of Food in the Last Year: Never true  Transportation Needs: No Transportation Needs (12/31/2022)   PRAPARE - Administrator, Civil Service (Medical): No  Lack of Transportation (Non-Medical): No  Physical Activity: Not on file  Stress: Not on file  Social Connections: Not on file  Intimate Partner Violence: Not At Risk (12/31/2022)   Humiliation, Afraid, Rape, and Kick questionnaire    Fear of Current or Ex-Partner: No    Emotionally Abused: No    Physically Abused: No    Sexually Abused: No     OBSERVATIONS/OBJECTIVE:  BP (!) 152/75 (BP Location: Right Arm, Patient Position: Sitting) Comment: asked to do 2nd BP and patient stated that's ok.  Pulse 60   Temp 98.8 F (37.1 C) (Oral)   Resp 16   Wt 182 lb 8 oz (82.8 kg)   SpO2 99%   BMI 28.58 kg/m  GENERAL: Patient is a well appearing female in no acute distress HEENT:  Sclerae anicteric.  Oropharynx clear and moist. No ulcerations or evidence of  oropharyngeal candidiasis. Neck is supple.  NODES:  No cervical, supraclavicular, or axillary lymphadenopathy palpated.  BREAST EXAM: left breast s/p lumpectomy and radiation, no sign of local recurrence, right breast benign LUNGS:  Clear to auscultation bilaterally.  No wheezes or rhonchi. HEART:  Regular rate and rhythm. No murmur appreciated. ABDOMEN:  Soft, nontender.  Positive, normoactive bowel sounds. No organomegaly palpated. MSK:  No focal spinal tenderness to palpation. Full range of motion bilaterally in the upper extremities. EXTREMITIES:  No peripheral edema.   SKIN:  Clear with no obvious rashes or skin changes. No nail dyscrasia. NEURO:  Nonfocal. Well oriented.  Appropriate affect.   LABORATORY DATA:  None for this visit.  DIAGNOSTIC IMAGING:  None for this visit.      ASSESSMENT AND PLAN:  Ms.. [name] is a pleasant 67 y.o. female with Stage IA left breast invasive lobular carcinoma, ER+/PR+/HER2-, diagnosed in 11/2022, treated with lumpectomy, adjuvant radiation therapy, and anti-estrogen therapy with Anastrozole beginning in 01/2023.  She presents to the Survivorship Clinic for our initial meeting and routine follow-up post-completion of treatment for breast cancer.   1. Stage IA left breast cancer:  [name] is continuing to recover from definitive treatment for breast cancer. She will follow-up with her medical oncologist, Dr.  Pamelia Hoit with history and physical exam per surveillance protocol.  She will continue her anti-estrogen therapy with Anastrozole. Thus far, she is tolerating the Anastrozole well, with minimal side effects. Her mammogram is due 10/2023; orders placed today.   Today, a comprehensive survivorship care plan and treatment summary was reviewed with the patient today detailing her breast cancer diagnosis, treatment course, potential late/long-term effects of treatment, appropriate follow-up care with recommendations for the future, and patient education  resources.  A copy of this summary, along with a letter will be sent to the patient's primary care provider via mail/fax/In Basket message after today's visit.    2. Bone health:  Given [name]'s age/history of breast cancer and her current treatment regimen including anti-estrogen therapy with Anastrozole, she is at risk for bone demineralization.  She underwent bone density testing in 10/2022 and we are in the process of obtaining results from [place].  Since she is taking anastrozole we do recommend bone density testing every 2 years.  She was given education on specific activities to promote bone health.  3. Cancer screening:  Due to [name]'s history and her age, she should receive screening for skin cancers, colon cancer, and gynecologic cancers.  The information and recommendations are listed on the patient's comprehensive care plan/treatment summary and were reviewed in detail with the  patient.    4. Health maintenance and wellness promotion: [name] was encouraged to consume 5-7 servings of fruits and vegetables per day. We reviewed the "Nutrition Rainbow" handout.  She was also encouraged to engage in moderate to vigorous exercise for 30 minutes per day most days of the week.  She was instructed to limit her alcohol consumption and continue to abstain from tobacco use.     5. Support services/counseling: It is not uncommon for this period of the patient's cancer care trajectory to be one of many emotions and stressors.   She was given information regarding our available services and encouraged to contact me with any questions or for help enrolling in any of our support group/programs.    Follow up instructions:    -Return to cancer center in 6 months for f/u with Dr. Pamelia Hoit  -Mammogram due in 10/2023 -Bone density testing in March 2026 -She is welcome to return back to the Survivorship Clinic at any time; no additional follow-up needed at this time.  -Consider referral back to  survivorship as a long-term survivor for continued surveillance  The patient was provided an opportunity to ask questions and all were answered. The patient agreed with the plan and demonstrated an understanding of the instructions.   Total encounter time:40 minutes*in face-to-face visit time, chart review, lab review, care coordination, order entry, and documentation of the encounter time.    Lillard Anes, NP 05/20/23 11:22 AM Medical Oncology and Hematology [facility] [address] [place], [place] [address] Tel. [phone]    Fax. [phone]  *Total Encounter Time as defined by the Centers for Medicare and Medicaid Services includes, in addition to the face-to-face time of a patient visit (documented in the note above) non-face-to-face time: obtaining and reviewing outside history, ordering and reviewing medications, tests or procedures, care coordination (communications with other health care professionals or caregivers) and documentation in the medical record.

## 2023-07-21 ENCOUNTER — Encounter: Payer: Self-pay | Admitting: Adult Health

## 2023-07-28 ENCOUNTER — Ambulatory Visit: Payer: Medicare HMO | Attending: Hematology and Oncology

## 2023-07-28 VITALS — Wt 182.4 lb

## 2023-07-28 DIAGNOSIS — Z483 Aftercare following surgery for neoplasm: Secondary | ICD-10-CM | POA: Insufficient documentation

## 2023-07-28 NOTE — Therapy (Signed)
OUTPATIENT PHYSICAL THERAPY SOZO SCREENING NOTE   Patient Name: [name] MRN: [id number] DOB:1956/01/28, 67 y.o., female Today's Date: 07/28/2023  PCP: Street, Stephanie Coup, MD REFERRING PROVIDER: Serena Croissant, MD   PT End of Session - 07/28/23 [phone]     Visit Number 2   # unchanged due to screen only   PT Start Time 0941    PT Stop Time 0945    PT Time Calculation (min) 4 min    Activity Tolerance Patient tolerated treatment well    Behavior During Therapy [facility] for tasks assessed/performed             Past Medical History:  Diagnosis Date   Breast cancer (HCC)    Breast cancer, right breast (HCC) 12/31/2012   Chronic allergic rhinitis due to pollen    Essential hypertension 10/08/2016   History of breast cancer in female 10/08/2016   History of radiation therapy    Right breast-  01/08/23-02/05/23-Dr. Antony Blackbird   Hyperlipidemia    Obesity    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/2016   Past Surgical History:  Procedure Laterality Date   AXILLARY SENTINEL NODE BIOPSY Left 12/03/2022   Procedure: LEFT AXILLARY SENTINEL NODE BIOPSY;  Surgeon: Emelia Loron, MD;  Location: [facility] OR;  Service: General;  Laterality: Left;   BREAST LUMPECTOMY  08/19/2008   right   BREAST LUMPECTOMY WITH RADIOACTIVE SEED AND SENTINEL LYMPH NODE BIOPSY Left 12/03/2022   Procedure: LEFT BREAST LUMPECTOMY WITH RADIOACTIVE SEED;  Surgeon: Emelia Loron, MD;  Location: [facility] OR;  Service: General;  Laterality: Left;   CHOLECYSTECTOMY  08/20/2003   COLONOSCOPY  2015   TUBAL LIGATION     Patient Active Problem List   Diagnosis Date Noted   Genetic testing 12/10/2022   Malignant neoplasm of upper-outer quadrant of left breast in female, estrogen receptor positive (HCC) 11/18/2022   Coronary disease nonobstructive based on CT coronary done in spring 2023 12/28/2021   Atypical chest pain 10/17/2021   Dyslipidemia 10/17/2021   Essential hypertension 10/08/2016   Osteopenia due to  cancer therapy 10/08/2016   History of breast cancer in female 10/08/2016   Vaginal dryness 01/01/2013   Hot flashes 01/01/2013   Breast cancer, right breast (HCC) 12/31/2012    REFERRING DIAG: left breast cancer at risk for lymphedema  THERAPY DIAG:  Aftercare following surgery for neoplasm  PERTINENT HISTORY: Hx of Rt breast cancer with lumpectomy and radiation in 2010 with 6 negative nodes removed. Now with Lt breast cancer ILC grade 2 ER/PR positive Ki67% 10% with Lt lumpectomy on 12/03/22 with 5 negative nodes removed.  Hx of frozen shoulder bil.  Oncotype testing pending. Radiation for sure.    PRECAUTIONS: left UE Lymphedema risk, None  SUBJECTIVE: Pt returns for her 3 month L-Dex screen.   PAIN:  Are you having pain? No  SOZO SCREENING: Patient was assessed today using the SOZO machine to determine the lymphedema index score. This was compared to her baseline score. It was determined that she is within the recommended range when compared to her baseline and no further action is needed at this time. She will continue SOZO screenings. These are done every 3 months for 2 years post operatively followed by every 6 months for 2 years, and then annually.   L-DEX FLOWSHEETS - 07/28/23 0900       L-DEX LYMPHEDEMA SCREENING   Measurement Type Unilateral    L-DEX MEASUREMENT EXTREMITY Upper Extremity    POSITION  Standing  DOMINANT SIDE Left    At Risk Side Right    BASELINE SCORE (UNILATERAL) 0.8    L-DEX SCORE (UNILATERAL) 3.3    VALUE CHANGE (UNILAT) 2.5               [name], [name], PTA 07/28/2023, 9:44 AM

## 2023-10-06 DIAGNOSIS — J40 Bronchitis, not specified as acute or chronic: Secondary | ICD-10-CM | POA: Diagnosis not present

## 2023-10-23 DIAGNOSIS — L578 Other skin changes due to chronic exposure to nonionizing radiation: Secondary | ICD-10-CM | POA: Diagnosis not present

## 2023-10-23 DIAGNOSIS — D225 Melanocytic nevi of trunk: Secondary | ICD-10-CM | POA: Diagnosis not present

## 2023-10-23 DIAGNOSIS — L821 Other seborrheic keratosis: Secondary | ICD-10-CM | POA: Diagnosis not present

## 2023-10-23 DIAGNOSIS — D2239 Melanocytic nevi of other parts of face: Secondary | ICD-10-CM | POA: Diagnosis not present

## 2023-10-24 DIAGNOSIS — Z6828 Body mass index (BMI) 28.0-28.9, adult: Secondary | ICD-10-CM | POA: Diagnosis not present

## 2023-10-24 DIAGNOSIS — J329 Chronic sinusitis, unspecified: Secondary | ICD-10-CM | POA: Diagnosis not present

## 2023-10-24 DIAGNOSIS — I479 Paroxysmal tachycardia, unspecified: Secondary | ICD-10-CM | POA: Diagnosis not present

## 2023-10-27 ENCOUNTER — Ambulatory Visit: Payer: Medicare HMO | Attending: Hematology and Oncology

## 2023-10-27 VITALS — Wt 180.2 lb

## 2023-10-27 DIAGNOSIS — Z483 Aftercare following surgery for neoplasm: Secondary | ICD-10-CM | POA: Insufficient documentation

## 2023-10-27 NOTE — Therapy (Signed)
 OUTPATIENT PHYSICAL THERAPY SOZO SCREENING NOTE   Patient Name: [name] MRN: [id number] DOB:23-Sep-1955, 68 y.o., female Today's Date: 10/27/2023  PCP: Street, Stephanie Coup, MD REFERRING PROVIDER: Serena Croissant, MD   PT End of Session - 10/27/23 [phone]     Visit Number 2   # unchanged due to screen only   PT Start Time 0937    PT Stop Time 0941    PT Time Calculation (min) 4 min    Activity Tolerance Patient tolerated treatment well    Behavior During Therapy [facility] for tasks assessed/performed             Past Medical History:  Diagnosis Date   Breast cancer (HCC)    Breast cancer, right breast (HCC) 12/31/2012   Chronic allergic rhinitis due to pollen    Essential hypertension 10/08/2016   History of breast cancer in female 10/08/2016   History of radiation therapy    Right breast-  01/08/23-02/05/23-Dr. Antony Blackbird   Hyperlipidemia    Obesity    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/2016   Past Surgical History:  Procedure Laterality Date   AXILLARY SENTINEL NODE BIOPSY Left 12/03/2022   Procedure: LEFT AXILLARY SENTINEL NODE BIOPSY;  Surgeon: Emelia Loron, MD;  Location: [facility] OR;  Service: General;  Laterality: Left;   BREAST LUMPECTOMY  08/19/2008   right   BREAST LUMPECTOMY WITH RADIOACTIVE SEED AND SENTINEL LYMPH NODE BIOPSY Left 12/03/2022   Procedure: LEFT BREAST LUMPECTOMY WITH RADIOACTIVE SEED;  Surgeon: Emelia Loron, MD;  Location: [facility] OR;  Service: General;  Laterality: Left;   CHOLECYSTECTOMY  08/20/2003   COLONOSCOPY  2015   TUBAL LIGATION     Patient Active Problem List   Diagnosis Date Noted   Genetic testing 12/10/2022   Malignant neoplasm of upper-outer quadrant of left breast in female, estrogen receptor positive (HCC) 11/18/2022   Coronary disease nonobstructive based on CT coronary done in spring 2023 12/28/2021   Atypical chest pain 10/17/2021   Dyslipidemia 10/17/2021   Essential hypertension 10/08/2016   Osteopenia due to  cancer therapy 10/08/2016   History of breast cancer in female 10/08/2016   Vaginal dryness 01/01/2013   Hot flashes 01/01/2013   Breast cancer, right breast (HCC) 12/31/2012    REFERRING DIAG: left breast cancer at risk for lymphedema  THERAPY DIAG:  Aftercare following surgery for neoplasm  PERTINENT HISTORY: Hx of Rt breast cancer with lumpectomy and radiation in 2010 with 6 negative nodes removed. Now with Lt breast cancer ILC grade 2 ER/PR positive Ki67% 10% with Lt lumpectomy on 12/03/22 with 5 negative nodes removed.  Hx of frozen shoulder bil.  Oncotype testing pending. Radiation for sure.    PRECAUTIONS: left UE Lymphedema risk, None  SUBJECTIVE: Pt returns for her 3 month L-Dex screen.   PAIN:  Are you having pain? No  SOZO SCREENING: Patient was assessed today using the SOZO machine to determine the lymphedema index score. This was compared to her baseline score. It was determined that she is within the recommended range when compared to her baseline and no further action is needed at this time. She will continue SOZO screenings. These are done every 3 months for 2 years post operatively followed by every 6 months for 2 years, and then annually.   L-DEX FLOWSHEETS - 10/27/23 0900       L-DEX LYMPHEDEMA SCREENING   Measurement Type Unilateral    L-DEX MEASUREMENT EXTREMITY Upper Extremity    POSITION  Standing  DOMINANT SIDE Left    At Risk Side Right    BASELINE SCORE (UNILATERAL) 0.8    L-DEX SCORE (UNILATERAL) 1.7    VALUE CHANGE (UNILAT) 0.9               [place], Welker, PTA 10/27/2023, 9:40 AM

## 2023-11-04 DIAGNOSIS — J301 Allergic rhinitis due to pollen: Secondary | ICD-10-CM | POA: Diagnosis not present

## 2023-11-04 DIAGNOSIS — I10 Essential (primary) hypertension: Secondary | ICD-10-CM | POA: Diagnosis not present

## 2023-11-04 DIAGNOSIS — I700 Atherosclerosis of aorta: Secondary | ICD-10-CM | POA: Diagnosis not present

## 2023-11-04 DIAGNOSIS — Z0000 Encounter for general adult medical examination without abnormal findings: Secondary | ICD-10-CM | POA: Diagnosis not present

## 2023-11-04 DIAGNOSIS — M8580 Other specified disorders of bone density and structure, unspecified site: Secondary | ICD-10-CM | POA: Diagnosis not present

## 2023-11-04 DIAGNOSIS — E663 Overweight: Secondary | ICD-10-CM | POA: Diagnosis not present

## 2023-11-04 DIAGNOSIS — E782 Mixed hyperlipidemia: Secondary | ICD-10-CM | POA: Diagnosis not present

## 2023-11-04 DIAGNOSIS — I479 Paroxysmal tachycardia, unspecified: Secondary | ICD-10-CM | POA: Diagnosis not present

## 2023-11-04 DIAGNOSIS — C50912 Malignant neoplasm of unspecified site of left female breast: Secondary | ICD-10-CM | POA: Diagnosis not present

## 2023-11-04 DIAGNOSIS — Z1211 Encounter for screening for malignant neoplasm of colon: Secondary | ICD-10-CM | POA: Diagnosis not present

## 2023-11-04 DIAGNOSIS — I2510 Atherosclerotic heart disease of native coronary artery without angina pectoris: Secondary | ICD-10-CM | POA: Diagnosis not present

## 2023-11-04 DIAGNOSIS — Z6828 Body mass index (BMI) 28.0-28.9, adult: Secondary | ICD-10-CM | POA: Diagnosis not present

## 2023-11-13 DIAGNOSIS — N6321 Unspecified lump in the left breast, upper outer quadrant: Secondary | ICD-10-CM | POA: Diagnosis not present

## 2023-11-13 DIAGNOSIS — Z853 Personal history of malignant neoplasm of breast: Secondary | ICD-10-CM | POA: Diagnosis not present

## 2023-11-13 DIAGNOSIS — N6489 Other specified disorders of breast: Secondary | ICD-10-CM | POA: Diagnosis not present

## 2023-11-14 DIAGNOSIS — Z131 Encounter for screening for diabetes mellitus: Secondary | ICD-10-CM | POA: Diagnosis not present

## 2023-11-14 DIAGNOSIS — Z79899 Other long term (current) drug therapy: Secondary | ICD-10-CM | POA: Diagnosis not present

## 2023-11-14 DIAGNOSIS — E785 Hyperlipidemia, unspecified: Secondary | ICD-10-CM | POA: Diagnosis not present

## 2023-11-14 DIAGNOSIS — I479 Paroxysmal tachycardia, unspecified: Secondary | ICD-10-CM | POA: Diagnosis not present

## 2023-11-18 ENCOUNTER — Encounter: Payer: Self-pay | Admitting: Hematology and Oncology

## 2023-11-21 DIAGNOSIS — F411 Generalized anxiety disorder: Secondary | ICD-10-CM | POA: Diagnosis not present

## 2023-11-21 DIAGNOSIS — I10 Essential (primary) hypertension: Secondary | ICD-10-CM | POA: Diagnosis not present

## 2023-11-21 DIAGNOSIS — I479 Paroxysmal tachycardia, unspecified: Secondary | ICD-10-CM | POA: Diagnosis not present

## 2023-11-21 DIAGNOSIS — Z6829 Body mass index (BMI) 29.0-29.9, adult: Secondary | ICD-10-CM | POA: Diagnosis not present

## 2023-11-21 DIAGNOSIS — E663 Overweight: Secondary | ICD-10-CM | POA: Diagnosis not present

## 2023-11-21 DIAGNOSIS — R4681 Obsessive-compulsive behavior: Secondary | ICD-10-CM | POA: Diagnosis not present

## 2023-11-25 ENCOUNTER — Ambulatory Visit: Payer: Medicare HMO | Admitting: Hematology and Oncology

## 2023-11-25 ENCOUNTER — Encounter: Payer: Self-pay | Admitting: Gastroenterology

## 2023-11-26 ENCOUNTER — Inpatient Hospital Stay: Payer: Medicare HMO | Attending: Hematology and Oncology | Admitting: Hematology and Oncology

## 2023-11-26 VITALS — BP 162/67 | HR 61 | Temp 97.9°F | Resp 18 | Ht 67.0 in | Wt 183.6 lb

## 2023-11-26 DIAGNOSIS — C50412 Malignant neoplasm of upper-outer quadrant of left female breast: Secondary | ICD-10-CM | POA: Insufficient documentation

## 2023-11-26 DIAGNOSIS — Z170 Estrogen receptor positive status [ER+]: Secondary | ICD-10-CM | POA: Insufficient documentation

## 2023-11-26 DIAGNOSIS — Z923 Personal history of irradiation: Secondary | ICD-10-CM | POA: Insufficient documentation

## 2023-11-26 DIAGNOSIS — Z79811 Long term (current) use of aromatase inhibitors: Secondary | ICD-10-CM | POA: Insufficient documentation

## 2023-11-26 DIAGNOSIS — R000 Tachycardia, unspecified: Secondary | ICD-10-CM | POA: Diagnosis not present

## 2023-11-26 MED ORDER — ANASTROZOLE 1 MG PO TABS: 1.0000 mg | ORAL_TABLET | Freq: Every day | ORAL | 3 refills | Status: AC

## 2023-11-26 NOTE — Progress Notes (Signed)
 Patient Care Team: Street, Stephanie Coup, MD as PCP - General (Family Medicine) Serena Croissant, MD as Consulting Physician (Hematology and Oncology) Antony Blackbird, MD as Consulting Physician (Radiation Oncology) Emelia Loron, MD as Consulting Physician (General Surgery)  DIAGNOSIS:  Encounter Diagnosis  Name Primary?   Malignant neoplasm of upper-outer quadrant of left breast in female, estrogen receptor positive (HCC) Yes    SUMMARY OF ONCOLOGIC HISTORY: Oncology History  Malignant neoplasm of upper-outer quadrant of left breast in female, estrogen receptor positive (HCC)  11/06/2022 Initial Diagnosis   Prior history of right breast cancer 2010 treated with lumpectomy radiation and 5 years of letrozole Screening mammogram detected left breast mass 5 mm 3 o'clock position, ultrasound-guided biopsy revealed grade 2 ILC with LCIS ER 100% PR 100%, Ki-67 10%, HER2 equivalent, FISH negative   11/20/2022 Cancer Staging   Staging form: Breast, AJCC 8th Edition - Clinical stage from 11/20/2022: Stage IA (cT1a, cN0, cM0, G2, ER+, PR+, HER2-) - Signed by Serena Croissant, MD on 11/20/2022 Stage prefix: Initial diagnosis Histologic grading system: 3 grade system   11/29/2022 Genetic Testing   Negative [facility] CustomNext-Cancer +RNAinsight Panel.  Report date is 11/29/2022.   The CustomNext-Cancer+RNAinsight panel offered by [facility] includes sequencing and rearrangement analysis for the following 50 genes: APC, ATM, BAP1, BARD1, BMPR1A, BRCA1, BRCA2, BRIP1, CDH1, CDK4, CDKN2A, CHEK2, DICER1, FH, FLCN, MEN1, MET, MLH1, MSH2, MSH6, MUTYH, NF1, NTHL1, PALB2, PMS2, PTEN, RAD51C, RAD51D, SDHA, SDHB, SDHC, SDHD, SMAD4, SMARCA4, STK11, TP53, TSC1, TSC2 and VHL (sequencing and deletion/duplication); AXIN2, CTNNA1, HOXB13, KIT, MITF, MSH3, PDGFRA, POLD1 and POLE (sequencing only); EPCAM and GREM1 (deletion/duplication only). RNA data is routinely analyzed for use in variant interpretation for all genes.    12/03/2022 Surgery   Left lumpectomy: Grade 2 ILC 0.8 cm, margins negative with LCIS, lymphovascular invasion not identified, ER 100%, PR 30%, HER2 negative, Ki-67 10% 0/5 lymph nodes negative   12/20/2022 Oncotype testing   Oncotype DX recurrence score 17 (risk of distant recurrence at 9 years: 5%)   01/08/2023 - 02/05/2023 Radiation Therapy   Plan Name: Breast_L_BH Site: Breast, Left Technique: 3D Mode: Photon Dose Per Fraction: 2.67 Gy Prescribed Dose (Delivered / Prescribed): 40.05 Gy / 40.05 Gy Prescribed Fxs (Delivered / Prescribed): 15 / 15   Plan Name: Brst_L_Bst_BH Site: Breast, Left Technique: 3D Mode: Photon Dose Per Fraction: 2 Gy Prescribed Dose (Delivered / Prescribed): 10 Gy / 10 Gy Prescribed Fxs (Delivered / Prescribed): 5 / 5   01/2023 -  Anti-estrogen oral therapy   Anastrozole x 10 years     CHIEF COMPLIANT: Surveillance of breast cancer on anastrozole therapy  HISTORY OF PRESENT ILLNESS:   History of Present Illness The patient, with a history of breast cancer, has been on anastrozole therapy. She initially experienced hot flashes, which have since settled down. She denies any breast pain or discomfort. She recently had a mammogram and ultrasound due to a spot on the left side, suspected to be fat necrosis from previous surgery. A repeat mammogram is scheduled in six months.  In January, the patient experienced heart racing, which she has been monitoring with a heart monitor. She has a history of similar episodes, with the last one occurring a year ago. She has noticed a higher than normal heart rate. She has been seeing her family doctor for this issue and has started medication for OCD and anxiety, which have been present since the passing of her spouse. She has a follow-up appointment with a cardiologist  scheduled in May.     ALLERGIES:  has no known allergies.  MEDICATIONS:  Current Outpatient Medications  Medication Sig Dispense Refill   ALPRAZolam  (XANAX) 0.5 MG tablet Take 0.25-0.5 mg by mouth 2 (two) times daily as needed.     anastrozole (ARIMIDEX) 1 MG tablet Take 1 tablet (1 mg total) by mouth daily. 90 tablet 3   aspirin EC 81 MG tablet Take 1 tablet (81 mg total) by mouth daily. Swallow whole. 90 tablet 3   calcium carbonate (OS-CAL) 1250 (500 Ca) MG chewable tablet Chew 1 tablet by mouth daily.     cetirizine (ZYRTEC) 10 MG tablet Take 10 mg by mouth daily as needed for allergies.     Cholecalciferol (D 1000) 25 MCG (1000 UT) capsule Take 1,000 Units by mouth daily.     valsartan-hydrochlorothiazide (DIOVAN-HCT) 160-12.5 MG per tablet Take 1 tablet by mouth daily.     No current facility-administered medications for this visit.    PHYSICAL EXAMINATION: ECOG PERFORMANCE STATUS: 1 - Symptomatic but completely ambulatory  Vitals:   11/26/23 0848  BP: (!) 162/67  Pulse: 61  Resp: 18  Temp: 97.9 F (36.6 C)  SpO2: 100%   Filed Weights   11/26/23 0848  Weight: 183 lb 9.6 oz (83.3 kg)    Physical Exam No palpable lumps or nodules in bilateral breasts or axilla     (exam performed in the presence of a chaperone)  LABORATORY DATA:  I have reviewed the data as listed    Latest Ref Rng & Units 11/20/2022   12:20 PM 10/24/2021   11:06 AM 12/29/2014   10:26 AM  CMP  Glucose 70 - 99 mg/dL 621  99  90   BUN 8 - 23 mg/dL 28  16  30.8   Creatinine 0.44 - 1.00 mg/dL 6.57  8.46  0.8   Sodium 135 - 145 mmol/L 136  135  140   Potassium 3.5 - 5.1 mmol/L 4.4  4.7  4.2   Chloride 98 - 111 mmol/L 97  95    CO2 22 - 32 mmol/L 33  27  27   Calcium 8.9 - 10.3 mg/dL 9.7  8.9  8.8   Total Protein 6.5 - 8.1 g/dL 7.6   6.6   Total Bilirubin 0.3 - 1.2 mg/dL 0.5   9.62   Alkaline Phos 38 - 126 U/L 133   110   AST 15 - 41 U/L 32   24   ALT 0 - 44 U/L 32   20     Lab Results  Component Value Date   WBC 6.5 11/20/2022   HGB 13.0 11/20/2022   HCT 39.5 11/20/2022   MCV 89.6 11/20/2022   PLT 265 11/20/2022   NEUTROABS 4.0  11/20/2022    ASSESSMENT & PLAN:  Malignant neoplasm of upper-outer quadrant of left breast in female, estrogen receptor positive (HCC) 11/06/2022:Screening mammogram detected left breast mass 5 mm 3 o'clock position, ultrasound-guided biopsy revealed grade 2 ILC with LCIS ER 100% PR 100%, Ki-67 10%, HER2 equivalent, FISH negative   (Prior history of right breast cancer 2010 treated with lumpectomy radiation and 5 years of letrozole)   12/03/2022:Left lumpectomy: Grade 2 ILC 0.8 cm, margins negative with LCIS, lymphovascular invasion not identified, ER 100%, PR 30%, HER2 negative, Ki-67 10% 0/5 lymph nodes negative Oncotype DX recurrence score 17 (risk of distant recurrence at 9 years: 5%)  02/05/2023: Completed adjuvant radiation   Treatment plan: Antiestrogen therapy with anastrozole  1 mg daily x 10 years started 02/06/2023 Anastrozole toxicities: Tolerating it well without any major problems.  Denies any significant hot flashes or joint aches or pains.  Stress/anxiety: Causing tachycardia: Patient is wearing a heart monitor  Breast cancer surveillance: Breast exam 11/26/2023: Benign Mammogram 10/24/2023 [facility] with ultrasound: 0.4 cm oval mass left breast fat necrosis, 15-month follow-up mammogram recommended  Return to clinic in 1 year for follow-up    No orders of the defined types were placed in this encounter.  The patient has a good understanding of the overall plan. she agrees with it. she will call with any problems that may develop before the next visit here. Total time spent: 30 mins including face to face time and time spent for planning, charting and co-ordination of care   Tamsen Meek, MD 11/26/23

## 2023-11-26 NOTE — Assessment & Plan Note (Signed)
 11/06/2022:Screening mammogram detected left breast mass 5 mm 3 o'clock position, ultrasound-guided biopsy revealed grade 2 ILC with LCIS ER 100% PR 100%, Ki-67 10%, HER2 equivalent, FISH negative   (Prior history of right breast cancer 2010 treated with lumpectomy radiation and 5 years of letrozole)   12/03/2022:Left lumpectomy: Grade 2 ILC 0.8 cm, margins negative with LCIS, lymphovascular invasion not identified, ER 100%, PR 30%, HER2 negative, Ki-67 10% 0/5 lymph nodes negative Oncotype DX recurrence score 17 (risk of distant recurrence at 9 years: 5%)  02/05/2023: Completed adjuvant radiation   Treatment plan: Antiestrogen therapy with anastrozole 1 mg daily x 10 years started 02/06/2023 Anastrozole toxicities:  Breast cancer surveillance: Breast exam 11/26/2023: Benign Mammogram 10/24/2023 [facility] with ultrasound: 0.4 cm oval mass left breast fat necrosis, 67-month follow-up mammogram recommended  Return to clinic in 1 year for follow-up

## 2023-12-05 DIAGNOSIS — I479 Paroxysmal tachycardia, unspecified: Secondary | ICD-10-CM | POA: Diagnosis not present

## 2023-12-22 ENCOUNTER — Encounter: Payer: Self-pay | Admitting: Cardiology

## 2023-12-22 ENCOUNTER — Ambulatory Visit: Attending: Cardiology | Admitting: Cardiology

## 2023-12-22 VITALS — BP 126/74 | HR 59 | Ht 66.5 in | Wt 181.8 lb

## 2023-12-22 DIAGNOSIS — I10 Essential (primary) hypertension: Secondary | ICD-10-CM | POA: Diagnosis not present

## 2023-12-22 DIAGNOSIS — R0789 Other chest pain: Secondary | ICD-10-CM

## 2023-12-22 DIAGNOSIS — I471 Supraventricular tachycardia, unspecified: Secondary | ICD-10-CM | POA: Diagnosis not present

## 2023-12-22 DIAGNOSIS — I2510 Atherosclerotic heart disease of native coronary artery without angina pectoris: Secondary | ICD-10-CM | POA: Diagnosis not present

## 2023-12-22 DIAGNOSIS — E785 Hyperlipidemia, unspecified: Secondary | ICD-10-CM | POA: Diagnosis not present

## 2023-12-22 MED ORDER — METOPROLOL SUCCINATE ER 25 MG PO TB24: 25.0000 mg | ORAL_TABLET | Freq: Every day | ORAL | 3 refills | Status: AC

## 2023-12-22 NOTE — Progress Notes (Unsigned)
 Cardiology Office Note:    Date:  12/22/2023   ID:  [name], DOB Jun 10, 1956, MRN [id number]  PCP:  Street, Renford Cartwright, MD  Cardiologist:  Ralene Burger, MD    Referring MD: Street, Renford Cartwright, *   Chief Complaint  Patient presents with   Follow-up    History of Present Illness:    [name] is a 68 y.o. female past medical history significant for coronary artery disease she did have atypical chest pain coronary CT angio was done after that which showed only mild disease in the LAD and obtuse marginal, calcium score was 84.6 she was started on aspirin  as well as risk factors modifications.  Comes today to months for follow-up.  Overall she says she is doing fine denies have any chest pain tightness squeezing pressure burning chest.  She did have some palpitations monitor has been placed on her which showed supraventricular tachycardia some APCs and PVCs multiple triggered events majority of showing extrasystole.  She said in terms of palpitation she is doing better.  She does have some anxiety.  She used to see the psychotherapist now she is on medications.  Does have dyslipidemia not on statin because of liver function test elevation  Past Medical History:  Diagnosis Date   Breast cancer (HCC)    Breast cancer, right breast (HCC) 12/31/2012   Chronic allergic rhinitis due to pollen    Essential hypertension 10/08/2016   History of breast cancer in female 10/08/2016   History of radiation therapy    Right breast-  01/08/23-02/05/23-Dr. Retta Caster   Hyperlipidemia    Obesity    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/2016    Past Surgical History:  Procedure Laterality Date   AXILLARY SENTINEL NODE BIOPSY Left 12/03/2022   Procedure: LEFT AXILLARY SENTINEL NODE BIOPSY;  Surgeon: Enid Harry, MD;  Location: [facility] OR;  Service: General;  Laterality: Left;   BREAST LUMPECTOMY  08/19/2008   right   BREAST LUMPECTOMY WITH RADIOACTIVE SEED AND SENTINEL LYMPH NODE  BIOPSY Left 12/03/2022   Procedure: LEFT BREAST LUMPECTOMY WITH RADIOACTIVE SEED;  Surgeon: Enid Harry, MD;  Location: [facility] OR;  Service: General;  Laterality: Left;   CHOLECYSTECTOMY  08/20/2003   COLONOSCOPY  2015   TUBAL LIGATION      Current Medications: Current Meds  Medication Sig   ALPRAZolam (XANAX) 0.5 MG tablet Take 0.25-0.5 mg by mouth 2 (two) times daily as needed for anxiety.   anastrozole  (ARIMIDEX ) 1 MG tablet Take 1 tablet (1 mg total) by mouth daily.   aspirin  EC 81 MG tablet Take 1 tablet (81 mg total) by mouth daily. Swallow whole.   calcium carbonate (OS-CAL) 1250 (500 Ca) MG chewable tablet Chew 1 tablet by mouth daily.   cetirizine (ZYRTEC) 10 MG tablet Take 10 mg by mouth daily as needed for allergies.   Cholecalciferol (D 1000) 25 MCG (1000 UT) capsule Take 1,000 Units by mouth daily.   PARoxetine (PAXIL) 10 MG tablet Take 10 mg by mouth daily.   valsartan-hydrochlorothiazide (DIOVAN-HCT) 160-12.5 MG per tablet Take 1 tablet by mouth daily.     Allergies:   Patient has no known allergies.   Social History   Socioeconomic History   Marital status: Widowed    Spouse name: Not on file   Number of children: Not on file   Years of education: Not on file   Highest education level: Not on file  Occupational History   Not on file  Tobacco  Use   Smoking status: Former    Types: Cigarettes   Smokeless tobacco: Never   Tobacco comments:    Quit over 25 years ago  Substance and Sexual Activity   Alcohol use: Yes    Comment: Occasional   Drug use: No   Sexual activity: Yes    Birth control/protection: Post-menopausal, Surgical  Other Topics Concern   Not on file  Social History Narrative   Not on file   Social Drivers of Health   Financial Resource Strain: Not on file  Food Insecurity: No Food Insecurity (12/31/2022)   Hunger Vital Sign    Worried About Running Out of Food in the Last Year: Never true    Ran Out of Food in the Last Year: Never  true  Transportation Needs: No Transportation Needs (12/31/2022)   PRAPARE - Administrator, Civil Service (Medical): No    Lack of Transportation (Non-Medical): No  Physical Activity: Not on file  Stress: Not on file  Social Connections: Not on file     Family History: The patient's family history includes Diabetes in her father and mother; Heart disease in her father; Hyperlipidemia in her mother; Hypertension in her mother; Kidney cancer (age of onset: 41) in her father; Prostate cancer in her maternal grandfather. ROS:   Please see the history of present illness.    All 14 point review of systems negative except as described per history of present illness  EKGs/Labs/Other Studies Reviewed:    EKG Interpretation Date/Time:  Monday Dec 22 2023 11:11:17 EDT Ventricular Rate:  55 PR Interval:  146 QRS Duration:  86 QT Interval:  430 QTC Calculation: 411 R Axis:   56  Text Interpretation: Sinus bradycardia Low voltage QRS Borderline ECG When compared with ECG of 26-Nov-2022 14:23, No significant change was found Confirmed by Ralene Burger [phone]) on 12/22/2023 11:28:18 AM    Recent Labs: No results found for requested labs within last 365 days.  Recent Lipid Panel    Component Value Date/Time   LDLDIRECT 105 (H) 10/17/2021 1641    Physical Exam:    VS:  BP 126/74 (BP Location: Right Arm, Patient Position: Sitting)   Pulse (!) 59   Ht 5' 6.5" (1.689 m)   Wt 181 lb 12.8 oz (82.5 kg)   SpO2 99%   BMI 28.90 kg/m     Wt Readings from Last 3 Encounters:  12/22/23 181 lb 12.8 oz (82.5 kg)  11/26/23 183 lb 9.6 oz (83.3 kg)  10/27/23 180 lb 4 oz (81.8 kg)     GEN:  Well nourished, well developed in no acute distress HEENT: Normal NECK: No JVD; No carotid bruits LYMPHATICS: No lymphadenopathy CARDIAC: RRR, no murmurs, no rubs, no gallops RESPIRATORY:  Clear to auscultation without rales, wheezing or rhonchi  ABDOMEN: Soft, non-tender,  non-distended MUSCULOSKELETAL:  No edema; No deformity  SKIN: Warm and dry LOWER EXTREMITIES: no swelling NEUROLOGIC:  Alert and oriented x 3 PSYCHIATRIC:  Normal affect   ASSESSMENT:    1. Essential hypertension   2. Coronary artery disease involving native coronary artery of native heart without angina pectoris   3. Atypical chest pain   4. Dyslipidemia   5. Supraventricular tachycardia (HCC)    PLAN:    In order of problems listed above:  Essential hypertension blood pressure well-controlled continue present management. Coronary disease stable from that point review on guideline directed medical therapy which I will continue however the issue is her cholesterol. Dyslipidemia I  did review her blood work LDL 132 HDL 68 however her ALT is 62.  She is scheduled to have repeated liver function test done if that is normal I will definitely suggest to start a statin like Lipitor 10 mg daily. Supraventricular tachycardia only 1 episode she felt.  Will start with small dose of beta-blocker and that should help also with her anxiety.   Medication Adjustments/Labs and Tests Ordered: Current medicines are reviewed at length with the patient today.  Concerns regarding medicines are outlined above.  Orders Placed This Encounter  Procedures   EKG 12-Lead   Medication changes: No orders of the defined types were placed in this encounter.   Signed, Manfred Seed, MD, [facility] 12/22/2023 11:53 AM    [facility]

## 2023-12-22 NOTE — Patient Instructions (Signed)
 Medication Instructions:   START: Metoprolol  Succinate 25mg  1 tablet daily   Lab Work: None Ordered If you have labs (blood work) drawn today and your tests are completely normal, you will receive your results only by: MyChart Message (if you have MyChart) OR A paper copy in the mail If you have any lab test that is abnormal or we need to change your treatment, we will call you to review the results.   Testing/Procedures: None Ordered   Follow-Up: At [facility], you and your health needs are our priority.  As part of our continuing mission to provide you with exceptional heart care, we have created designated Provider Care Teams.  These Care Teams include your primary Cardiologist (physician) and Advanced Practice Providers (APPs -  Physician Assistants and Nurse Practitioners) who all work together to provide you with the care you need, when you need it.  We recommend signing up for the patient portal called "MyChart".  Sign up information is provided on this After Visit Summary.  MyChart is used to connect with patients for Virtual Visits (Telemedicine).  Patients are able to view lab/test results, encounter notes, upcoming appointments, etc.  Non-urgent messages can be sent to your provider as well.   To learn more about what you can do with MyChart, go to [email or web address].    Your next appointment:   12 month(s)  The format for your next appointment:   In Person  Provider:   Ralene Burger, MD    Other Instructions NA

## 2023-12-23 DIAGNOSIS — C50412 Malignant neoplasm of upper-outer quadrant of left female breast: Secondary | ICD-10-CM | POA: Diagnosis not present

## 2023-12-23 DIAGNOSIS — Z170 Estrogen receptor positive status [ER+]: Secondary | ICD-10-CM | POA: Diagnosis not present

## 2023-12-29 ENCOUNTER — Encounter (HOSPITAL_COMMUNITY): Payer: Self-pay

## 2024-01-05 ENCOUNTER — Encounter

## 2024-01-07 DIAGNOSIS — Z6829 Body mass index (BMI) 29.0-29.9, adult: Secondary | ICD-10-CM | POA: Diagnosis not present

## 2024-01-07 DIAGNOSIS — F411 Generalized anxiety disorder: Secondary | ICD-10-CM | POA: Diagnosis not present

## 2024-01-07 DIAGNOSIS — I2510 Atherosclerotic heart disease of native coronary artery without angina pectoris: Secondary | ICD-10-CM | POA: Diagnosis not present

## 2024-01-07 DIAGNOSIS — E663 Overweight: Secondary | ICD-10-CM | POA: Diagnosis not present

## 2024-01-07 DIAGNOSIS — I479 Paroxysmal tachycardia, unspecified: Secondary | ICD-10-CM | POA: Diagnosis not present

## 2024-01-07 DIAGNOSIS — E782 Mixed hyperlipidemia: Secondary | ICD-10-CM | POA: Diagnosis not present

## 2024-01-07 DIAGNOSIS — I700 Atherosclerosis of aorta: Secondary | ICD-10-CM | POA: Diagnosis not present

## 2024-01-23 ENCOUNTER — Encounter: Admitting: Gastroenterology

## 2024-01-26 ENCOUNTER — Ambulatory Visit: Attending: Hematology and Oncology

## 2024-01-26 VITALS — Wt 185.2 lb

## 2024-01-26 DIAGNOSIS — Z483 Aftercare following surgery for neoplasm: Secondary | ICD-10-CM | POA: Insufficient documentation

## 2024-01-26 NOTE — Therapy (Signed)
 OUTPATIENT PHYSICAL THERAPY SOZO SCREENING NOTE   Patient Name: [name] MRN: [id number] DOB:[birth year], 68 y.o., female Today's Date: 01/26/2024  PCP: Street, Renford Cartwright, MD REFERRING PROVIDER: Cameron Cea, MD   PT End of Session - 01/26/24 1538     Visit Number 2   # unchanged due to screen only   PT Start Time 1537    PT Stop Time 1541    PT Time Calculation (min) 4 min    Activity Tolerance Patient tolerated treatment well    Behavior During Therapy [facility] for tasks assessed/performed             Past Medical History:  Diagnosis Date   Breast cancer (HCC)    Breast cancer, right breast (HCC) 12/31/2012   Chronic allergic rhinitis due to pollen    Essential hypertension 10/08/2016   History of breast cancer in female 10/08/2016   History of radiation therapy    Right breast-  01/08/23-02/05/23-Dr. Retta Caster   Hyperlipidemia    Obesity    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/2016   Past Surgical History:  Procedure Laterality Date   AXILLARY SENTINEL NODE BIOPSY Left 12/03/2022   Procedure: LEFT AXILLARY SENTINEL NODE BIOPSY;  Surgeon: Enid Harry, MD;  Location: [facility] OR;  Service: General;  Laterality: Left;   BREAST LUMPECTOMY  08/19/2008   right   BREAST LUMPECTOMY WITH RADIOACTIVE SEED AND SENTINEL LYMPH NODE BIOPSY Left 12/03/2022   Procedure: LEFT BREAST LUMPECTOMY WITH RADIOACTIVE SEED;  Surgeon: Enid Harry, MD;  Location: [facility] OR;  Service: General;  Laterality: Left;   CHOLECYSTECTOMY  08/20/2003   COLONOSCOPY  2015   TUBAL LIGATION     Patient Active Problem List   Diagnosis Date Noted   Supraventricular tachycardia (HCC) 12/22/2023   Genetic testing 12/10/2022   Malignant neoplasm of upper-outer quadrant of left breast in female, estrogen receptor positive (HCC) 11/18/2022   Coronary disease nonobstructive based on CT coronary done in spring 2023 12/28/2021   Atypical chest pain 10/17/2021   Dyslipidemia 10/17/2021   Essential  hypertension 10/08/2016   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/2016   History of breast cancer in female 10/08/2016   Vaginal dryness 01/01/2013   Hot flashes 01/01/2013   Breast cancer, right breast (HCC) 12/31/2012    REFERRING DIAG: left breast cancer at risk for lymphedema  THERAPY DIAG: Aftercare following surgery for neoplasm  PERTINENT HISTORY: Hx of Rt breast cancer with lumpectomy and radiation in 2010 with 6 negative nodes removed. Now with Lt breast cancer ILC grade 2 ER/PR positive Ki67% 10% with Lt lumpectomy on 12/03/22 with 5 negative nodes removed.  Hx of frozen shoulder bil.  Oncotype testing pending. Radiation for sure.    PRECAUTIONS: left UE Lymphedema risk, None  SUBJECTIVE: Pt returns for her 3 month L-Dex screen.   PAIN:  Are you having pain? No  SOZO SCREENING: Patient was assessed today using the SOZO machine to determine the lymphedema index score. This was compared to her baseline score. It was determined that she is within the recommended range when compared to her baseline and no further action is needed at this time. She will continue SOZO screenings. These are done every 3 months for 2 years post operatively followed by every 6 months for 2 years, and then annually.   L-DEX FLOWSHEETS - 01/26/24 1500       L-DEX LYMPHEDEMA SCREENING   Measurement Type Unilateral    L-DEX MEASUREMENT EXTREMITY Upper Extremity  POSITION  Standing    DOMINANT SIDE Left    At Risk Side Right    BASELINE SCORE (UNILATERAL) 0.8    L-DEX SCORE (UNILATERAL) 0    VALUE CHANGE (UNILAT) -0.8               Donella, Pascarella, PTA 01/26/2024, 3:39 PM

## 2024-02-17 ENCOUNTER — Other Ambulatory Visit: Payer: Self-pay

## 2024-02-17 ENCOUNTER — Ambulatory Visit (AMBULATORY_SURGERY_CENTER)

## 2024-02-17 ENCOUNTER — Encounter: Payer: Self-pay | Admitting: Gastroenterology

## 2024-02-17 VITALS — Ht 67.0 in | Wt 184.0 lb

## 2024-02-17 DIAGNOSIS — Z1211 Encounter for screening for malignant neoplasm of colon: Secondary | ICD-10-CM

## 2024-02-17 MED ORDER — NA SULFATE-K SULFATE-MG SULF 17.5-3.13-1.6 GM/177ML PO SOLN: 1.0000 | Freq: Once | ORAL | 0 refills | Status: AC

## 2024-02-17 NOTE — Progress Notes (Signed)
 Denies allergies to eggs or soy products. Denies complication of anesthesia or sedation. Denies use of weight loss medication. Denies use of O2.   Emmi instructions given for colonoscopy.

## 2024-03-03 ENCOUNTER — Telehealth: Payer: Self-pay | Admitting: Gastroenterology

## 2024-03-03 NOTE — Telephone Encounter (Signed)
 Returned patient call. Clarified diet leading up to colonoscopy.

## 2024-03-03 NOTE — Telephone Encounter (Signed)
 PT is scheduled for a colonoscopy on 7/21 and wants to know if she can have smoothies with strawberry and blueberries added the day before. She would also like to know if she can take a carbonated protein collagen drink called Limu Lean Burn and it has fiber in it. Please advise.

## 2024-03-08 ENCOUNTER — Ambulatory Visit (AMBULATORY_SURGERY_CENTER): Admitting: Gastroenterology

## 2024-03-08 ENCOUNTER — Encounter: Payer: Self-pay | Admitting: Gastroenterology

## 2024-03-08 VITALS — BP 86/41 | HR 64 | Temp 97.2°F | Resp 17 | Ht 66.0 in | Wt 184.0 lb

## 2024-03-08 DIAGNOSIS — I10 Essential (primary) hypertension: Secondary | ICD-10-CM | POA: Diagnosis not present

## 2024-03-08 DIAGNOSIS — Z1211 Encounter for screening for malignant neoplasm of colon: Secondary | ICD-10-CM | POA: Diagnosis not present

## 2024-03-08 DIAGNOSIS — K640 First degree hemorrhoids: Secondary | ICD-10-CM

## 2024-03-08 DIAGNOSIS — E669 Obesity, unspecified: Secondary | ICD-10-CM | POA: Diagnosis not present

## 2024-03-08 DIAGNOSIS — K5730 Diverticulosis of large intestine without perforation or abscess without bleeding: Secondary | ICD-10-CM | POA: Diagnosis not present

## 2024-03-08 DIAGNOSIS — F32A Depression, unspecified: Secondary | ICD-10-CM | POA: Diagnosis not present

## 2024-03-08 DIAGNOSIS — F419 Anxiety disorder, unspecified: Secondary | ICD-10-CM | POA: Diagnosis not present

## 2024-03-08 MED ORDER — SODIUM CHLORIDE 0.9 % IV SOLN: 500.0000 mL | Freq: Once | INTRAVENOUS | Status: DC

## 2024-03-08 NOTE — Patient Instructions (Addendum)
-   Diverticulosis and internal hemorrhoids - High fiber diet. - Continue present medications. - Repeat colonoscopy is not recommended due to    current age (1 years or older) for screening    purposes. Hence, repeat colonoscopy only if with    any new problems. - Return to GI office PRN.  YOU HAD AN ENDOSCOPIC PROCEDURE TODAY AT THE [facility]:   Refer to the procedure report that was given to you for any specific questions about what was found during the examination.  If the procedure report does not answer your questions, please call your gastroenterologist to clarify.  If you requested that your care partner not be given the details of your procedure findings, then the procedure report has been included in a sealed envelope for you to review at your convenience later.  YOU SHOULD EXPECT: Some feelings of bloating in the abdomen. Passage of more gas than usual.  Walking can help get rid of the air that was put into your GI tract during the procedure and reduce the bloating. If you had a lower endoscopy (such as a colonoscopy or flexible sigmoidoscopy) you may notice spotting of blood in your stool or on the toilet paper. If you underwent a bowel prep for your procedure, you may not have a normal bowel movement for a few days.  Please Note:  You might notice some irritation and congestion in your nose or some drainage.  This is from the oxygen used during your procedure.  There is no need for concern and it should clear up in a day or so.  SYMPTOMS TO REPORT IMMEDIATELY:  Following lower endoscopy (colonoscopy or flexible sigmoidoscopy):  Excessive amounts of blood in the stool  Significant tenderness or worsening of abdominal pains  Swelling of the abdomen that is new, acute  Fever of 100F or higher   For urgent or emergent issues, a gastroenterologist can be reached at any hour by calling [phone]. Do not use MyChart messaging for urgent concerns.    DIET:  We do  recommend a small meal at first, but then you may proceed to your regular diet.  Drink plenty of fluids but you should avoid alcoholic beverages for 24 hours.  ACTIVITY:  You should plan to take it easy for the rest of today and you should NOT DRIVE or use heavy machinery until tomorrow (because of the sedation medicines used during the test).    FOLLOW UP: Our staff will call the number listed on your records the next business day following your procedure.  We will call around 7:15- 8:00 am to check on you and address any questions or concerns that you may have regarding the information given to you following your procedure. If we do not reach you, we will leave a message.     If any biopsies were taken you will be contacted by phone or by letter within the next 1-3 weeks.  Please call us  at [phone] if you have not heard about the biopsies in 3 weeks.    SIGNATURES/CONFIDENTIALITY: You and/or your care partner have signed paperwork which will be entered into your electronic medical record.  These signatures attest to the fact that that the information above on your After Visit Summary has been reviewed and is understood.  Full responsibility of the confidentiality of this discharge information lies with you and/or your care-partner.

## 2024-03-08 NOTE — Op Note (Signed)
 [facility] Patient Name: [name] Procedure Date: 03/08/2024 10:22 AM MRN: [id number] Endoscopist: Lynnie Bring , MD, [id number] Age: 68 Referring MD:  Date of Birth: [birth year] Gender: Female Account #: [id number] Procedure:                Colonoscopy Indications:              Screening for colorectal malignant neoplasm Medicines:                Monitored Anesthesia Care Procedure:                Pre-Anesthesia Assessment:                           - Prior to the procedure, a History and Physical                            was performed, and patient medications and                            allergies were reviewed. The patient's tolerance of                            previous anesthesia was also reviewed. The risks                            and benefits of the procedure and the sedation                            options and risks were discussed with the patient.                            All questions were answered, and informed consent                            was obtained. Prior Anticoagulants: The patient has                            taken no anticoagulant or antiplatelet agents. ASA                            Grade Assessment: II - A patient with mild systemic                            disease. After reviewing the risks and benefits,                            the patient was deemed in satisfactory condition to                            undergo the procedure.                           After obtaining informed consent, the colonoscope  was passed under direct vision. Throughout the                            procedure, the patient's blood pressure, pulse, and                            oxygen saturations were monitored continuously. The                            CF [id number] #[id number] was introduced through the anus                            and advanced to the the cecum, identified by                            appendiceal orifice  and ileocecal valve. The                            colonoscopy was performed without difficulty. The                            patient tolerated the procedure well. The quality                            of the bowel preparation was good. The ileocecal                            valve, TI, appendiceal orifice, and rectum were                            photographed. Scope In: 10:40:01 AM Scope Out: 10:57:38 AM Scope Withdrawal Time: 0 hours 11 minutes 39 seconds  Total Procedure Duration: 0 hours 17 minutes 37 seconds  Findings:                 Many medium-mouthed diverticula were found in the                            sigmoid colon. Few diverticula with stool impacted.                           Non-bleeding internal hemorrhoids were found during                            retroflexion. The hemorrhoids were small and Grade                            I (internal hemorrhoids that do not prolapse).                           The exam was otherwise without abnormality on                            direct and retroflexion views. 2 cm of TI was  intubated. TI was normal. Complications:            No immediate complications. Estimated Blood Loss:     Estimated blood loss: none. Impression:               - Moderate sigmoid diverticulosis.                           - Non-bleeding internal hemorrhoids.                           - The examination was otherwise normal on direct                            and retroflexion views.                           - No specimens collected. Recommendation:           - Patient has a contact number available for                            emergencies. The signs and symptoms of potential                            delayed complications were discussed with the                            patient. Return to normal activities tomorrow.                            Written discharge instructions were provided to the                             patient.                           - High fiber diet.                           - Continue present medications.                           - Repeat colonoscopy is not recommended due to                            current age (65 years or older) for screening                            purposes. Hence, repeat colonoscopy only if with                            any new problems.                           - Return to GI office PRN. Lynnie Bring, MD 03/08/2024 11:01:54 AM This report has been signed electronically.

## 2024-03-08 NOTE — Progress Notes (Signed)
 Pt's states no medical or surgical changes since previsit or office visit.

## 2024-03-08 NOTE — Progress Notes (Signed)
 [facility] History and Physical   Primary Care Physician:  Street, Lonni HERO, MD   Reason for Procedure:   CRC screening  Plan:    colon     HPI: [name] is a 68 y.o. female    Past Medical History:  Diagnosis Date   Allergy    Anxiety    Breast cancer (HCC)    Breast cancer, right breast (HCC) 12/31/2012   Chronic allergic rhinitis due to pollen    Depression    Essential hypertension 10/08/2016   GERD (gastroesophageal reflux disease)    History of breast cancer in female 10/08/2016   History of radiation therapy    Right breast-  01/08/23-02/05/23-Dr. 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/2016    Past Surgical History:  Procedure Laterality Date   AXILLARY SENTINEL NODE BIOPSY Left 12/03/2022   Procedure: LEFT AXILLARY SENTINEL NODE BIOPSY;  Surgeon: Ebbie Cough, MD;  Location: [facility] OR;  Service: General;  Laterality: Left;   BREAST LUMPECTOMY  08/19/2008   right   BREAST LUMPECTOMY WITH RADIOACTIVE SEED AND SENTINEL LYMPH NODE BIOPSY Left 12/03/2022   Procedure: LEFT BREAST LUMPECTOMY WITH RADIOACTIVE SEED;  Surgeon: Ebbie Cough, MD;  Location: [facility] OR;  Service: General;  Laterality: Left;   CHOLECYSTECTOMY  08/20/2003   COLONOSCOPY  2015   TUBAL LIGATION      Prior to Admission medications   Medication Sig Start Date End Date Taking? Authorizing Provider  ALPRAZolam (XANAX) 0.5 MG tablet Take 0.25-0.5 mg by mouth 2 (two) times daily as needed for anxiety. 11/10/23  Yes [provider]  anastrozole  (ARIMIDEX ) 1 MG tablet Take 1 tablet (1 mg total) by mouth daily. 11/26/23  Yes Odean Potts, MD  aspirin  EC 81 MG tablet Take 1 tablet (81 mg total) by mouth daily. Swallow whole. 10/17/21  Yes Krasowski, Robert J, MD  calcium carbonate (OS-CAL) 1250 (500 Ca) MG chewable tablet Chew 1 tablet by mouth daily.   Yes [provider]  cetirizine (ZYRTEC) 10 MG tablet Take 10 mg  by mouth daily as needed for allergies.   Yes [provider]  Cholecalciferol (D 1000) 25 MCG (1000 UT) capsule Take 1,000 Units by mouth daily.   Yes [provider]  metoprolol  succinate (TOPROL  XL) 25 MG 24 hr tablet Take 1 tablet (25 mg total) by mouth daily. 12/22/23  Yes Krasowski, Robert J, MD  valsartan-hydrochlorothiazide (DIOVAN-HCT) 160-12.5 MG per tablet Take 1 tablet by mouth daily.   Yes [provider]    Current Outpatient Medications  Medication Sig Dispense Refill   ALPRAZolam (XANAX) 0.5 MG tablet Take 0.25-0.5 mg by mouth 2 (two) times daily as needed for anxiety.     anastrozole  (ARIMIDEX ) 1 MG tablet Take 1 tablet (1 mg total) by mouth daily. 90 tablet 3   aspirin  EC 81 MG tablet Take 1 tablet (81 mg total) by mouth daily. Swallow whole. 90 tablet 3   calcium carbonate (OS-CAL) 1250 (500 Ca) MG chewable tablet Chew 1 tablet by mouth daily.     cetirizine (ZYRTEC) 10 MG tablet Take 10 mg by mouth daily as needed for allergies.     Cholecalciferol (D 1000) 25 MCG (1000 UT) capsule Take 1,000 Units by mouth daily.     metoprolol  succinate (TOPROL  XL) 25 MG 24 hr tablet Take 1 tablet (25 mg total) by mouth daily. 90 tablet 3   valsartan-hydrochlorothiazide (DIOVAN-HCT) 160-12.5 MG  per tablet Take 1 tablet by mouth daily.     Current Facility-Administered Medications  Medication Dose Route Frequency Provider Last Rate Last Admin   0.9 %  sodium chloride  infusion  500 mL Intravenous Once Charlanne Groom, MD        Allergies as of 03/08/2024   (No Known Allergies)    Family History  Problem Relation Age of Onset   Hyperlipidemia Mother    Diabetes Mother    Hypertension Mother    Kidney cancer Father 24   Heart disease Father    Diabetes Father    Prostate cancer Maternal Grandfather        dx 59s   Colon cancer Neg Hx    Esophageal cancer Neg Hx    Stomach cancer Neg Hx    Rectal cancer Neg Hx     Social History   Socioeconomic  History   Marital status: Widowed    Spouse name: Not on file   Number of children: Not on file   Years of education: Not on file   Highest education level: Not on file  Occupational History   Not on file  Tobacco Use   Smoking status: Former    Types: Cigarettes   Smokeless tobacco: Never   Tobacco comments:    Quit over 25 years ago  Substance and Sexual Activity   Alcohol use: Yes    Comment: Occasional   Drug use: No   Sexual activity: Yes    Birth control/protection: Post-menopausal, Surgical  Other Topics Concern   Not on file  Social History Narrative   Not on file   Social Drivers of Health   Financial Resource Strain: Not on file  Food Insecurity: No Food Insecurity (12/31/2022)   Hunger Vital Sign    Worried About Running Out of Food in the Last Year: Never true    Ran Out of Food in the Last Year: Never true  Transportation Needs: No Transportation Needs (12/31/2022)   PRAPARE - Administrator, Civil Service (Medical): No    Lack of Transportation (Non-Medical): No  Physical Activity: Not on file  Stress: Not on file  Social Connections: Not on file  Intimate Partner Violence: Not At Risk (12/31/2022)   Humiliation, Afraid, Rape, and Kick questionnaire    Fear of Current or Ex-Partner: No    Emotionally Abused: No    Physically Abused: No    Sexually Abused: No    Review of Systems: Positive for none All other review of systems negative except as mentioned in the HPI.  Physical Exam: Vital signs in last 24 hours: @VSRANGES @   General:   Alert,  Well-developed, well-nourished, pleasant and cooperative in NAD Lungs:  Clear throughout to auscultation.   Heart:  Regular rate and rhythm; no murmurs, clicks, rubs,  or gallops. Abdomen:  Soft, nontender and nondistended. Normal bowel sounds.   Neuro/Psych:  Alert and cooperative. Normal mood and affect. A and O x 3    No significant changes were identified.  The patient continues to be an  appropriate candidate for the planned procedure and anesthesia.   Anselm Charlanne, MD. [facility] 03/08/2024 10:28 AM@

## 2024-03-08 NOTE — Progress Notes (Signed)
 Sedate, gd SR, tolerated procedure well, VSS, report to RN

## 2024-03-09 ENCOUNTER — Telehealth: Payer: Self-pay | Admitting: *Deleted

## 2024-03-09 NOTE — Telephone Encounter (Signed)
  Follow up Call-     03/08/2024    9:43 AM  Call back number  Post procedure Call Back phone  # [phone]  Permission to leave phone message Yes     Patient questions:  Do you have a fever, pain , or abdominal swelling? No. Pain Score  0 *  Have you tolerated food without any problems? Yes.    Have you been able to return to your normal activities? Yes.    Do you have any questions about your discharge instructions: Diet   No. Medications  No. Follow up visit  No.  Do you have questions or concerns about your Care? No.  Actions: * If pain score is 4 or above: No action needed, pain <4.

## 2024-04-20 ENCOUNTER — Encounter: Payer: Self-pay | Admitting: Hematology and Oncology

## 2024-04-23 ENCOUNTER — Encounter: Payer: Self-pay | Admitting: Adult Health

## 2024-04-23 ENCOUNTER — Inpatient Hospital Stay: Attending: Adult Health | Admitting: Adult Health

## 2024-04-23 ENCOUNTER — Inpatient Hospital Stay

## 2024-04-23 ENCOUNTER — Other Ambulatory Visit: Payer: Self-pay

## 2024-04-23 VITALS — BP 154/63 | HR 64 | Temp 98.7°F | Resp 18 | Ht 66.0 in | Wt 181.6 lb

## 2024-04-23 DIAGNOSIS — Z170 Estrogen receptor positive status [ER+]: Secondary | ICD-10-CM | POA: Insufficient documentation

## 2024-04-23 DIAGNOSIS — I471 Supraventricular tachycardia, unspecified: Secondary | ICD-10-CM | POA: Insufficient documentation

## 2024-04-23 DIAGNOSIS — Z79811 Long term (current) use of aromatase inhibitors: Secondary | ICD-10-CM | POA: Insufficient documentation

## 2024-04-23 DIAGNOSIS — Z8051 Family history of malignant neoplasm of kidney: Secondary | ICD-10-CM | POA: Insufficient documentation

## 2024-04-23 DIAGNOSIS — Z87891 Personal history of nicotine dependence: Secondary | ICD-10-CM | POA: Insufficient documentation

## 2024-04-23 DIAGNOSIS — M8580 Other specified disorders of bone density and structure, unspecified site: Secondary | ICD-10-CM | POA: Diagnosis not present

## 2024-04-23 DIAGNOSIS — Z1732 Human epidermal growth factor receptor 2 negative status: Secondary | ICD-10-CM | POA: Diagnosis not present

## 2024-04-23 DIAGNOSIS — C50412 Malignant neoplasm of upper-outer quadrant of left female breast: Secondary | ICD-10-CM | POA: Diagnosis not present

## 2024-04-23 DIAGNOSIS — E785 Hyperlipidemia, unspecified: Secondary | ICD-10-CM | POA: Diagnosis not present

## 2024-04-23 DIAGNOSIS — I10 Essential (primary) hypertension: Secondary | ICD-10-CM | POA: Diagnosis not present

## 2024-04-23 DIAGNOSIS — Z1721 Progesterone receptor positive status: Secondary | ICD-10-CM | POA: Insufficient documentation

## 2024-04-23 DIAGNOSIS — C50411 Malignant neoplasm of upper-outer quadrant of right female breast: Secondary | ICD-10-CM | POA: Insufficient documentation

## 2024-04-23 DIAGNOSIS — Z923 Personal history of irradiation: Secondary | ICD-10-CM | POA: Insufficient documentation

## 2024-04-23 DIAGNOSIS — R232 Flushing: Secondary | ICD-10-CM | POA: Diagnosis not present

## 2024-04-23 LAB — CMP (CANCER CENTER ONLY)
ALT: 44 U/L (ref 0–44)
AST: 44 U/L — ABNORMAL HIGH (ref 15–41)
Albumin: 4.3 g/dL (ref 3.5–5.0)
Alkaline Phosphatase: 108 U/L (ref 38–126)
Anion gap: 5 (ref 5–15)
BUN: 25 mg/dL — ABNORMAL HIGH (ref 8–23)
CO2: 35 mmol/L — ABNORMAL HIGH (ref 22–32)
Calcium: 9.7 mg/dL (ref 8.9–10.3)
Chloride: 99 mmol/L (ref 98–111)
Creatinine: 0.75 mg/dL (ref 0.44–1.00)
GFR, Estimated: >60 mL/min (ref >60)
Glucose, Bld: 109 mg/dL — ABNORMAL HIGH (ref 70–99)
Potassium: 4.4 mmol/L (ref 3.5–5.1)
Sodium: 139 mmol/L (ref 135–145)
Total Bilirubin: 0.4 mg/dL (ref 0.0–1.2)
Total Protein: 7.6 g/dL (ref 6.5–8.1)

## 2024-04-23 LAB — CBC WITH DIFFERENTIAL (CANCER CENTER ONLY)
Abs Immature Granulocytes: 0.01 K/uL (ref 0.00–0.07)
Basophils Absolute: 0.1 K/uL (ref 0.0–0.1)
Basophils Relative: 1 %
Eosinophils Absolute: 0.1 K/uL (ref 0.0–0.5)
Eosinophils Relative: 1 %
HCT: 40.2 % (ref 36.0–46.0)
Hemoglobin: 13.6 g/dL (ref 12.0–15.0)
Immature Granulocytes: 0 %
Lymphocytes Relative: 23 %
Lymphs Abs: 1.2 K/uL (ref 0.7–4.0)
MCH: 29.8 pg (ref 26.0–34.0)
MCHC: 33.8 g/dL (ref 30.0–36.0)
MCV: 88 fL (ref 80.0–100.0)
Monocytes Absolute: 0.6 K/uL (ref 0.1–1.0)
Monocytes Relative: 11 %
Neutro Abs: 3.2 K/uL (ref 1.7–7.7)
Neutrophils Relative %: 64 %
Platelet Count: 232 K/uL (ref 150–400)
RBC: 4.57 MIL/uL (ref 3.87–5.11)
RDW: 12.4 % (ref 11.5–15.5)
WBC Count: 5.1 K/uL (ref 4.0–10.5)
nRBC: 0 % (ref 0.0–0.2)

## 2024-04-23 LAB — PROTIME-INR
INR: 0.9 (ref 0.8–1.2)
Prothrombin Time: 12.5 s (ref 11.4–15.2)

## 2024-04-23 NOTE — Progress Notes (Signed)
 [facility] Cancer Follow up:    Street, Yvonne HERO, MD [address] [place] [address]   DIAGNOSIS: Cancer Staging  Breast cancer, right breast [facility]) Staging form: Breast, AJCC 7th Edition - Clinical: Stage IA (T1c, N0, M0) - Signed by Layla Sandria BROCKS, MD on 01/03/2015  Malignant neoplasm of upper-outer quadrant of left breast in female, estrogen receptor positive (HCC) Staging form: Breast, AJCC 8th Edition - Clinical stage from 11/20/2022: Stage IA (cT1a, cN0, cM0, G2, ER+, PR+, HER2-) - Signed by Odean Potts, MD on 11/20/2022 Stage prefix: Initial diagnosis Histologic grading system: 3 grade system    SUMMARY OF ONCOLOGIC HISTORY: Oncology History  Malignant neoplasm of upper-outer quadrant of left breast in female, estrogen receptor positive (HCC)  11/06/2022 Initial Diagnosis   Prior history of right breast cancer 2010 treated with lumpectomy radiation and 5 years of letrozole  Screening mammogram detected left breast mass 5 mm 3 o'clock position, ultrasound-guided biopsy revealed grade 2 ILC with LCIS ER 100% PR 100%, Ki-67 10%, HER2 equivalent, FISH negative   11/20/2022 Cancer Staging   Staging form: Breast, AJCC 8th Edition - Clinical stage from 11/20/2022: Stage IA (cT1a, cN0, cM0, G2, ER+, PR+, HER2-) - Signed by Odean Potts, MD on 11/20/2022 Stage prefix: Initial diagnosis Histologic grading system: 3 grade system   11/29/2022 Genetic Testing   Negative [facility] CustomNext-Cancer +RNAinsight Panel.  Report date is 11/29/2022.   The CustomNext-Cancer+RNAinsight panel offered by [facility] includes sequencing and rearrangement analysis for the following 50 genes: APC, ATM, BAP1, BARD1, BMPR1A, BRCA1, BRCA2, BRIP1, CDH1, CDK4, CDKN2A, CHEK2, DICER1, FH, FLCN, MEN1, MET, MLH1, MSH2, MSH6, MUTYH, NF1, NTHL1, PALB2, PMS2, PTEN, RAD51C, RAD51D, SDHA, SDHB, SDHC, SDHD, SMAD4, SMARCA4, STK11, TP53, TSC1, TSC2 and VHL (sequencing and deletion/duplication);  AXIN2, CTNNA1, HOXB13, KIT, MITF, MSH3, PDGFRA, POLD1 and POLE (sequencing only); EPCAM and GREM1 (deletion/duplication only). RNA data is routinely analyzed for use in variant interpretation for all genes.   12/03/2022 Surgery   Left lumpectomy: Grade 2 ILC 0.8 cm, margins negative with LCIS, lymphovascular invasion not identified, ER 100%, PR 30%, HER2 negative, Ki-67 10% 0/5 lymph nodes negative   12/20/2022 Oncotype testing   Oncotype DX recurrence score 17 (risk of distant recurrence at 9 years: 5%)   01/08/2023 - 02/05/2023 Radiation Therapy   Plan Name: Breast_L_BH Site: Breast, Left Technique: 3D Mode: Photon Dose Per Fraction: 2.67 Gy Prescribed Dose (Delivered / Prescribed): 40.05 Gy / 40.05 Gy Prescribed Fxs (Delivered / Prescribed): 15 / 15   Plan Name: Brst_L_Bst_BH Site: Breast, Left Technique: 3D Mode: Photon Dose Per Fraction: 2 Gy Prescribed Dose (Delivered / Prescribed): 10 Gy / 10 Gy Prescribed Fxs (Delivered / Prescribed): 5 / 5   01/2023 -  Anti-estrogen oral therapy   Anastrozole  x 10 years     CURRENT THERAPY: Anastrozole   INTERVAL HISTORY  [name] 68 y.o. female returns for f/u and evaluation of bruising of her left breast that started a few days ago and has diffusely worsened.  She is s/p lumpectomy and adjuvant radiation on that breast.  She denies any recent trauma to the area, or easy bruising on any other place on her body.    Her most recent mammogram detected a 0.4 cm area of concern in left breast and ultrasound determined it was likely fat necrosis.  Repeat mammogram and ultrasound was recommended to occur 05/14/2024.     Patient Active Problem List   Diagnosis Date Noted   Supraventricular  tachycardia (HCC) 12/22/2023   Genetic testing 12/10/2022   Malignant neoplasm of upper-outer quadrant of left breast in female, estrogen receptor positive (HCC) 11/18/2022   Coronary disease nonobstructive based on CT coronary done in spring 2023  12/28/2021   Atypical chest pain 10/17/2021   Dyslipidemia 10/17/2021   Essential hypertension 10/08/2016   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/2016   History of breast cancer in female 10/08/2016   Vaginal dryness 01/01/2013   Hot flashes 01/01/2013   Breast cancer, right breast (HCC) 12/31/2012    has no known allergies.  MEDICAL HISTORY: Past Medical History:  Diagnosis Date   Allergy    Anxiety    Breast cancer (HCC)    Breast cancer, right breast (HCC) 12/31/2012   Chronic allergic rhinitis due to pollen    Depression    Essential hypertension 10/08/2016   GERD (gastroesophageal reflux disease)    History of breast cancer in female 10/08/2016   History of radiation therapy    Right breast-  01/08/23-02/05/23-Dr. 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/2016    SURGICAL HISTORY: Past Surgical History:  Procedure Laterality Date   AXILLARY SENTINEL NODE BIOPSY Left 12/03/2022   Procedure: LEFT AXILLARY SENTINEL NODE BIOPSY;  Surgeon: Ebbie Cough, MD;  Location: [facility] OR;  Service: General;  Laterality: Left;   BREAST LUMPECTOMY  08/19/2008   right   BREAST LUMPECTOMY WITH RADIOACTIVE SEED AND SENTINEL LYMPH NODE BIOPSY Left 12/03/2022   Procedure: LEFT BREAST LUMPECTOMY WITH RADIOACTIVE SEED;  Surgeon: Ebbie Cough, MD;  Location: [facility] OR;  Service: General;  Laterality: Left;   CHOLECYSTECTOMY  08/20/2003   COLONOSCOPY  2015   TUBAL LIGATION      SOCIAL HISTORY: Social History   Socioeconomic History   Marital status: Widowed    Spouse name: Not on file   Number of children: Not on file   Years of education: Not on file   Highest education level: Not on file  Occupational History   Not on file  Tobacco Use   Smoking status: Former    Types: Cigarettes   Smokeless tobacco: Never   Tobacco comments:    Quit over 25 years ago  Substance and Sexual Activity   Alcohol use: Yes    Comment: Occasional    Drug use: No   Sexual activity: Yes    Birth control/protection: Post-menopausal, Surgical  Other Topics Concern   Not on file  Social History Narrative   Not on file   Social Drivers of Health   Financial Resource Strain: Not on file  Food Insecurity: No Food Insecurity (12/31/2022)   Hunger Vital Sign    Worried About Running Out of Food in the Last Year: Never true    Ran Out of Food in the Last Year: Never true  Transportation Needs: No Transportation Needs (12/31/2022)   PRAPARE - Administrator, Civil Service (Medical): No    Lack of Transportation (Non-Medical): No  Physical Activity: Not on file  Stress: Not on file  Social Connections: Not on file  Intimate Partner Violence: Not At Risk (12/31/2022)   Humiliation, Afraid, Rape, and Kick questionnaire    Fear of Current or Ex-Partner: No    Emotionally Abused: No    Physically Abused: No    Sexually Abused: No    FAMILY HISTORY: Family History  Problem Relation Age of Onset   Hyperlipidemia Mother    Diabetes Mother  Hypertension Mother    Kidney cancer Father 65   Heart disease Father    Diabetes Father    Prostate cancer Maternal Grandfather        dx 47s   Colon cancer Neg Hx    Esophageal cancer Neg Hx    Stomach cancer Neg Hx    Rectal cancer Neg Hx     Review of Systems  Constitutional:  Negative for appetite change, chills, fatigue, fever and unexpected weight change.  HENT:   Negative for hearing loss, lump/mass and trouble swallowing.   Eyes:  Negative for eye problems and icterus.  Respiratory:  Negative for chest tightness, cough and shortness of breath.   Cardiovascular:  Negative for chest pain, leg swelling and palpitations.  Gastrointestinal:  Negative for abdominal distention, abdominal pain, constipation, diarrhea, nausea and vomiting.  Endocrine: Negative for hot flashes.  Genitourinary:  Negative for difficulty urinating.   Musculoskeletal:  Negative for arthralgias.  Skin:   Negative for itching and rash.  Neurological:  Negative for dizziness, extremity weakness, headaches and numbness.  Hematological:  Negative for adenopathy. Does not bruise/bleed easily.  Psychiatric/Behavioral:  Negative for depression. The patient is not nervous/anxious.       PHYSICAL EXAMINATION Vitals:   04/23/24 1038  BP: (!) 154/63  Pulse: 64  Resp: 18  Temp: 98.7 F (37.1 C)  SpO2: 100%  Physical Exam Constitutional:      General: She is not in acute distress.    Appearance: Normal appearance. She is not toxic-appearing.  HENT:     Head: Normocephalic and atraumatic.     Mouth/Throat:     Mouth: Mucous membranes are moist.     Pharynx: Oropharynx is clear. No oropharyngeal exudate or posterior oropharyngeal erythema.  Eyes:     General: No scleral icterus. Cardiovascular:     Rate and Rhythm: Normal rate and regular rhythm.     Pulses: Normal pulses.     Heart sounds: Normal heart sounds.  Pulmonary:     Effort: Pulmonary effort is normal.     Breath sounds: Normal breath sounds.  Chest:     Comments: See picture below from today.   Abdominal:     General: Abdomen is flat. Bowel sounds are normal. There is no distension.     Palpations: Abdomen is soft.     Tenderness: There is no abdominal tenderness.  Musculoskeletal:        General: No swelling.     Cervical back: Neck supple.  Lymphadenopathy:     Cervical: No cervical adenopathy.     Upper Body:     Right upper body: No supraclavicular or axillary adenopathy.     Left upper body: No supraclavicular or axillary adenopathy.  Skin:    General: Skin is warm and dry.     Findings: No rash.  Neurological:     General: No focal deficit present.     Mental Status: She is alert.  Psychiatric:        Mood and Affect: Mood normal.        Behavior: Behavior normal.        ASSESSMENT and THERAPY PLAN:   [name] is a 68 year old woman with stage IA left breast invasive lobular carcinoma diagnosed in 11/2022  s/p lumpectomy, adjuvant radiation, and antiestrogen therapy with Anastrozole  that began in 01/2023.    New bruising on left breast: violaceous rash, STAT MRI ordered to r/o angiosarcoma or recurrence, obtaining labs with CBC and INR.  All questions were answered. The patient knows to call the clinic with any problems, questions or concerns. We can certainly see the patient much sooner if necessary.  Total encounter time:20 minutes*in face-to-face visit time, chart review, lab review, care coordination, order entry, and documentation of the encounter time.    Morna Kendall, NP 04/23/24 11:05 AM Medical Oncology and Hematology [facility] [address] [place], [place] [address] Tel. [phone]    Fax. [phone]  *Total Encounter Time as defined by the Centers for Medicare and Medicaid Services includes, in addition to the face-to-face time of a patient visit (documented in the note above) non-face-to-face time: obtaining and reviewing outside history, ordering and reviewing medications, tests or procedures, care coordination (communications with other health care professionals or caregivers) and documentation in the medical record.

## 2024-04-26 ENCOUNTER — Ambulatory Visit
Admission: RE | Admit: 2024-04-26 | Discharge: 2024-04-26 | Disposition: A | Discharge status: Home / Self Care | Source: Ambulatory Visit | Attending: Adult Health | Admitting: Adult Health

## 2024-04-26 DIAGNOSIS — Z853 Personal history of malignant neoplasm of breast: Secondary | ICD-10-CM | POA: Diagnosis not present

## 2024-04-26 DIAGNOSIS — C50412 Malignant neoplasm of upper-outer quadrant of left female breast: Secondary | ICD-10-CM

## 2024-04-26 MED ORDER — GADOPICLENOL 0.5 MMOL/ML IV SOLN
8.0000 mL | Freq: Once | INTRAVENOUS | Status: AC | PRN
  Administered 2024-04-26: 8 mL via INTRAVENOUS

## 2024-04-27 ENCOUNTER — Encounter: Payer: Self-pay | Admitting: Adult Health

## 2024-04-27 DIAGNOSIS — D485 Neoplasm of uncertain behavior of skin: Secondary | ICD-10-CM | POA: Diagnosis not present

## 2024-04-29 ENCOUNTER — Telehealth: Payer: Self-pay | Admitting: Adult Health

## 2024-04-29 NOTE — Telephone Encounter (Signed)
 Called patient and reviewed results from MRI and the recommendation for consideration of skin punch biopsy.  She notes the bruising that was covering her entire breast has lightened up some.  I recommended she see Dr. Ebbie to review breast skin change, decide whether to proceed with skin biopsy, and to decide whether to pursue mammogram to follow up left breast abnormality from mammogram in 10/2023.    We also discussed that residual magtrace was noted on her MRI and made area near her lumpectomy difficult to visualize.  If she needs MRI again, radiology recommended contrast enhanced mammogram instead. [name] verbalized understanding and agreement with the plan.    Patient agreed to see Dr. Ebbie, and his CMA scheduled patient to see him on 05/06/2024 at 3 pm.    Yvonne Kendall, NP 04/29/24 2:50 PM Medical Oncology and Hematology [facility] [address] [place], [place] [address] Tel. [phone]    Fax. [phone]

## 2024-05-03 ENCOUNTER — Telehealth: Payer: Self-pay | Admitting: *Deleted

## 2024-05-03 NOTE — Telephone Encounter (Signed)
 [name] from [facility] Radiology called about  pt's  MRI breast results done  04/26/24.   Results printed and gave to  Yvonne Sharps, [name] to give to Dr. Gudena for review.  [name]'s  phone  [phone]

## 2024-05-06 DIAGNOSIS — L308 Other specified dermatitis: Secondary | ICD-10-CM | POA: Diagnosis not present

## 2024-05-06 DIAGNOSIS — Z170 Estrogen receptor positive status [ER+]: Secondary | ICD-10-CM | POA: Diagnosis not present

## 2024-05-06 DIAGNOSIS — C50412 Malignant neoplasm of upper-outer quadrant of left female breast: Secondary | ICD-10-CM | POA: Diagnosis not present

## 2024-05-10 ENCOUNTER — Ambulatory Visit: Attending: Hematology and Oncology

## 2024-05-10 VITALS — Wt 183.4 lb

## 2024-05-10 DIAGNOSIS — Z483 Aftercare following surgery for neoplasm: Secondary | ICD-10-CM | POA: Insufficient documentation

## 2024-05-10 NOTE — Therapy (Signed)
 OUTPATIENT PHYSICAL THERAPY SOZO SCREENING NOTE   Patient Name: [name] MRN: [id number] DOB:06-May-1956, 68 y.o., female Today's Date: 05/10/2024  PCP: Rusty, Lonni HERO, MD REFERRING PROVIDER: Odean Potts, MD   PT End of Session - 05/10/24 1018     Visit Number 2   # unchanged due to screen only   PT Start Time 1016    PT Stop Time 1020    PT Time Calculation (min) 4 min    Activity Tolerance Patient tolerated treatment well    Behavior During Therapy [facility] for tasks assessed/performed          Past Medical History:  Diagnosis Date   Allergy    Anxiety    Breast cancer (HCC)    Breast cancer, right breast (HCC) 12/31/2012   Chronic allergic rhinitis due to pollen    Depression    Essential hypertension 10/08/2016   GERD (gastroesophageal reflux disease)    History of breast cancer in female 10/08/2016   History of radiation therapy    Right breast-  01/08/23-02/05/23-Dr. 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/2016   Past Surgical History:  Procedure Laterality Date   AXILLARY SENTINEL NODE BIOPSY Left 12/03/2022   Procedure: LEFT AXILLARY SENTINEL NODE BIOPSY;  Surgeon: Ebbie Cough, MD;  Location: [facility] OR;  Service: General;  Laterality: Left;   BREAST LUMPECTOMY  08/19/2008   right   BREAST LUMPECTOMY WITH RADIOACTIVE SEED AND SENTINEL LYMPH NODE BIOPSY Left 12/03/2022   Procedure: LEFT BREAST LUMPECTOMY WITH RADIOACTIVE SEED;  Surgeon: Ebbie Cough, MD;  Location: [facility] OR;  Service: General;  Laterality: Left;   CHOLECYSTECTOMY  08/20/2003   COLONOSCOPY  2015   TUBAL LIGATION     Patient Active Problem List   Diagnosis Date Noted   Supraventricular tachycardia (HCC) 12/22/2023   Genetic testing 12/10/2022   Malignant neoplasm of upper-outer quadrant of left breast in female, estrogen receptor positive (HCC) 11/18/2022   Coronary disease nonobstructive based on CT coronary done in spring 2023  12/28/2021   Atypical chest pain 10/17/2021   Dyslipidemia 10/17/2021   Essential hypertension 10/08/2016   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/2016   History of breast cancer in female 10/08/2016   Vaginal dryness 01/01/2013   Hot flashes 01/01/2013   Breast cancer, right breast (HCC) 12/31/2012    REFERRING DIAG: left breast cancer at risk for lymphedema  THERAPY DIAG: Aftercare following surgery for neoplasm  PERTINENT HISTORY: Hx of Rt breast cancer with lumpectomy and radiation in 2010 with 6 negative nodes removed. Now with Lt breast cancer ILC grade 2 ER/PR positive Ki67% 10% with Lt lumpectomy on 12/03/22 with 5 negative nodes removed.  Hx of frozen shoulder bil.  Oncotype testing pending. Radiation for sure.    PRECAUTIONS: left UE Lymphedema risk, None  SUBJECTIVE: Pt returns for her 3 month L-Dex screen.   PAIN:  Are you having pain? No  SOZO SCREENING: Patient was assessed today using the SOZO machine to determine the lymphedema index score. This was compared to her baseline score. It was determined that she is within the recommended range when compared to her baseline and no further action is needed at this time. She will continue SOZO screenings. These are done every 3 months for 2 years post operatively followed by every 6 months for 2 years, and then annually.   L-DEX FLOWSHEETS - 05/10/24 1000       L-DEX LYMPHEDEMA SCREENING  Measurement Type Unilateral    L-DEX MEASUREMENT EXTREMITY Upper Extremity    POSITION  Standing    DOMINANT SIDE Left    At Risk Side Right    BASELINE SCORE (UNILATERAL) 0.8    L-DEX SCORE (UNILATERAL) 2.6    VALUE CHANGE (UNILAT) 1.8            Dessirae, Scarola, PTA 05/10/2024, 10:19 AM

## 2024-05-25 DIAGNOSIS — C44519 Basal cell carcinoma of skin of other part of trunk: Secondary | ICD-10-CM | POA: Diagnosis not present

## 2024-06-17 DIAGNOSIS — L308 Other specified dermatitis: Secondary | ICD-10-CM | POA: Diagnosis not present

## 2024-06-22 DIAGNOSIS — N6002 Solitary cyst of left breast: Secondary | ICD-10-CM | POA: Diagnosis not present

## 2024-06-22 DIAGNOSIS — R928 Other abnormal and inconclusive findings on diagnostic imaging of breast: Secondary | ICD-10-CM | POA: Diagnosis not present

## 2024-08-09 ENCOUNTER — Ambulatory Visit: Attending: Hematology and Oncology

## 2024-08-09 VITALS — Wt 187.4 lb

## 2024-08-09 DIAGNOSIS — Z483 Aftercare following surgery for neoplasm: Secondary | ICD-10-CM | POA: Insufficient documentation

## 2024-08-09 NOTE — Therapy (Signed)
 " OUTPATIENT PHYSICAL THERAPY SOZO SCREENING NOTE   Patient Name: [name] MRN: [id number] DOB:1956/01/17, 68 y.o., female Today's Date: 08/09/2024  PCP: Street, Lonni HERO, MD REFERRING PROVIDER: Odean Potts, MD   PT End of Session - 08/09/24 1027     Visit Number 2   # unchanged due to screen only   PT Start Time 1025    PT Stop Time 1029    PT Time Calculation (min) 4 min    Activity Tolerance Patient tolerated treatment well    Behavior During Therapy [facility] for tasks assessed/performed          Past Medical History:  Diagnosis Date   Allergy    Anxiety    Breast cancer (HCC)    Breast cancer, right breast (HCC) 12/31/2012   Chronic allergic rhinitis due to pollen    Depression    Essential hypertension 10/08/2016   GERD (gastroesophageal reflux disease)    History of breast cancer in female 10/08/2016   History of radiation therapy    Right breast-  01/08/23-02/05/23-Dr. 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/2016   Past Surgical History:  Procedure Laterality Date   AXILLARY SENTINEL NODE BIOPSY Left 12/03/2022   Procedure: LEFT AXILLARY SENTINEL NODE BIOPSY;  Surgeon: Ebbie Cough, MD;  Location: [facility] OR;  Service: General;  Laterality: Left;   BREAST LUMPECTOMY  08/19/2008   right   BREAST LUMPECTOMY WITH RADIOACTIVE SEED AND SENTINEL LYMPH NODE BIOPSY Left 12/03/2022   Procedure: LEFT BREAST LUMPECTOMY WITH RADIOACTIVE SEED;  Surgeon: Ebbie Cough, MD;  Location: [facility] OR;  Service: General;  Laterality: Left;   CHOLECYSTECTOMY  08/20/2003   COLONOSCOPY  2015   TUBAL LIGATION     Patient Active Problem List   Diagnosis Date Noted   Supraventricular tachycardia 12/22/2023   Genetic testing 12/10/2022   Malignant neoplasm of upper-outer quadrant of left breast in female, estrogen receptor positive (HCC) 11/18/2022   Coronary disease nonobstructive based on CT coronary done in spring 2023 12/28/2021    Atypical chest pain 10/17/2021   Dyslipidemia 10/17/2021   Essential hypertension 10/08/2016   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/2016   History of breast cancer in female 10/08/2016   Vaginal dryness 01/01/2013   Hot flashes 01/01/2013   Breast cancer, right breast (HCC) 12/31/2012    REFERRING DIAG: left breast cancer at risk for lymphedema  THERAPY DIAG: Aftercare following surgery for neoplasm  PERTINENT HISTORY: Hx of Rt breast cancer with lumpectomy and radiation in 2010 with 6 negative nodes removed. Now with Lt breast cancer ILC grade 2 ER/PR positive Ki67% 10% with Lt lumpectomy on 12/03/22 with 5 negative nodes removed.  Hx of frozen shoulder bil.  Oncotype testing pending. Radiation for sure.    PRECAUTIONS: left UE Lymphedema risk, None  SUBJECTIVE: Pt returns for her 3 month L-Dex screen.   PAIN:  Are you having pain? No  SOZO SCREENING: Patient was assessed today using the SOZO machine to determine the lymphedema index score. This was compared to her baseline score. It was determined that she is within the recommended range when compared to her baseline and no further action is needed at this time. She will continue SOZO screenings. These are done every 3 months for 2 years post operatively followed by every 6 months for 2 years, and then annually.   L-DEX FLOWSHEETS - 08/09/24 1000       L-DEX LYMPHEDEMA SCREENING  Measurement Type Unilateral    L-DEX MEASUREMENT EXTREMITY Upper Extremity    POSITION  Standing    DOMINANT SIDE Left    At Risk Side Right    BASELINE SCORE (UNILATERAL) 0.8    L-DEX SCORE (UNILATERAL) 1.7    VALUE CHANGE (UNILAT) 0.9          P: Cont every 3 month L-Dex screens until 11/2024, then can transition to 6 months until 11/2026.  Aden Berwyn Caldron, PTA 08/09/2024, 10:28 AM     "

## 2024-11-08 ENCOUNTER — Ambulatory Visit

## 2024-11-25 ENCOUNTER — Ambulatory Visit: Admitting: Hematology and Oncology
